# Patient Record
Sex: Male | Born: 1955
Health system: Southern US, Community
[De-identification: ages and names within clinical notes are randomized; demographics above are authoritative.]

## PROBLEM LIST (undated history)

## (undated) DIAGNOSIS — I10 Essential (primary) hypertension: Secondary | ICD-10-CM

## (undated) HISTORY — DX: Essential (primary) hypertension: I10

## (undated) HISTORY — PX: HERNIA REPAIR: SHX51

---

## 2005-06-25 ENCOUNTER — Ambulatory Visit: Payer: Self-pay | Admitting: Family Medicine

## 2005-10-05 ENCOUNTER — Ambulatory Visit: Payer: Self-pay | Admitting: Internal Medicine

## 2005-10-12 ENCOUNTER — Ambulatory Visit: Payer: Self-pay | Admitting: Internal Medicine

## 2007-02-13 ENCOUNTER — Ambulatory Visit: Payer: Self-pay | Admitting: Internal Medicine

## 2007-02-16 ENCOUNTER — Encounter: Payer: Self-pay | Admitting: Internal Medicine

## 2007-02-16 ENCOUNTER — Ambulatory Visit: Payer: Self-pay

## 2007-04-20 ENCOUNTER — Ambulatory Visit: Payer: Self-pay | Admitting: Internal Medicine

## 2007-04-20 LAB — CONVERTED CEMR LAB
ALT: 18 units/L (ref 0–53)
AST: 23 units/L (ref 0–37)
Albumin: 4.1 g/dL (ref 3.5–5.2)
Alkaline Phosphatase: 58 units/L (ref 39–117)
BUN: 18 mg/dL (ref 6–23)
Basophils Absolute: 0 10*3/uL (ref 0.0–0.1)
Basophils Relative: 0.3 % (ref 0.0–1.0)
Bilirubin Urine: NEGATIVE
Bilirubin, Direct: 0.1 mg/dL (ref 0.0–0.3)
Blood in Urine, dipstick: NEGATIVE
CO2: 31 meq/L (ref 19–32)
Calcium: 9.4 mg/dL (ref 8.4–10.5)
Chloride: 106 meq/L (ref 96–112)
Cholesterol: 163 mg/dL (ref 0–200)
Creatinine, Ser: 0.8 mg/dL (ref 0.4–1.5)
Eosinophils Absolute: 0.1 10*3/uL (ref 0.0–0.6)
Eosinophils Relative: 1.4 % (ref 0.0–5.0)
GFR calc Af Amer: 132 mL/min
GFR calc non Af Amer: 109 mL/min
Glucose, Bld: 97 mg/dL (ref 70–99)
Glucose, Urine, Semiquant: NEGATIVE
HCT: 42 % (ref 39.0–52.0)
HDL: 37.8 mg/dL — ABNORMAL LOW (ref >39.0)
Hemoglobin: 14.7 g/dL (ref 13.0–17.0)
Ketones, urine, test strip: NEGATIVE
LDL Cholesterol: 120 mg/dL — ABNORMAL HIGH (ref 0–99)
Lymphocytes Relative: 30.3 % (ref 12.0–46.0)
MCHC: 34.9 g/dL (ref 30.0–36.0)
MCV: 87.8 fL (ref 78.0–100.0)
Monocytes Absolute: 0.4 10*3/uL (ref 0.2–0.7)
Monocytes Relative: 9.5 % (ref 3.0–11.0)
Neutro Abs: 2.7 10*3/uL (ref 1.4–7.7)
Neutrophils Relative %: 58.5 % (ref 43.0–77.0)
Nitrite: NEGATIVE
PSA: 0.47 ng/mL (ref 0.10–4.00)
Platelets: 187 10*3/uL (ref 150–400)
Potassium: 5.1 meq/L (ref 3.5–5.1)
Protein, U semiquant: NEGATIVE
RBC: 4.79 M/uL (ref 4.22–5.81)
RDW: 12.8 % (ref 11.5–14.6)
Sodium: 141 meq/L (ref 135–145)
Specific Gravity, Urine: 1.025
TSH: 0.65 microintl units/mL (ref 0.35–5.50)
Total Bilirubin: 0.8 mg/dL (ref 0.3–1.2)
Total CHOL/HDL Ratio: 4.3
Total Protein: 6.6 g/dL (ref 6.0–8.3)
Triglycerides: 24 mg/dL (ref 0–149)
Urobilinogen, UA: 1
VLDL: 5 mg/dL (ref 0–40)
WBC Urine, dipstick: NEGATIVE
WBC: 4.6 10*3/uL (ref 4.5–10.5)
pH: 6

## 2007-04-27 ENCOUNTER — Ambulatory Visit: Payer: Self-pay | Admitting: Internal Medicine

## 2007-05-29 ENCOUNTER — Ambulatory Visit: Payer: Self-pay | Admitting: Internal Medicine

## 2007-05-29 LAB — CONVERTED CEMR LAB
Blood in Urine, dipstick: NEGATIVE
Glucose, Urine, Semiquant: NEGATIVE
Nitrite: NEGATIVE
Protein, U semiquant: NEGATIVE
Specific Gravity, Urine: >=1.03
Urobilinogen, UA: 0.2
WBC Urine, dipstick: NEGATIVE
pH: 5

## 2007-05-30 ENCOUNTER — Encounter: Payer: Self-pay | Admitting: Internal Medicine

## 2007-06-02 ENCOUNTER — Ambulatory Visit: Payer: Self-pay | Admitting: Internal Medicine

## 2007-09-22 ENCOUNTER — Encounter: Payer: Self-pay | Admitting: Internal Medicine

## 2007-09-22 LAB — HM COLONOSCOPY

## 2007-10-12 ENCOUNTER — Encounter: Payer: Self-pay | Admitting: Internal Medicine

## 2009-02-28 ENCOUNTER — Ambulatory Visit: Payer: Self-pay | Admitting: Internal Medicine

## 2009-02-28 LAB — CONVERTED CEMR LAB
AST: 22 units/L (ref 0–37)
BUN: 23 mg/dL (ref 6–23)
Basophils Absolute: 0 10*3/uL (ref 0.0–0.1)
Bilirubin Urine: NEGATIVE
Bilirubin, Direct: 0 mg/dL (ref 0.0–0.3)
Blood in Urine, dipstick: NEGATIVE
Cholesterol: 174 mg/dL (ref 0–200)
Creatinine, Ser: 1 mg/dL (ref 0.4–1.5)
GFR calc non Af Amer: 83.31 mL/min (ref >60)
Glucose, Bld: 112 mg/dL — ABNORMAL HIGH (ref 70–99)
Glucose, Urine, Semiquant: NEGATIVE
HCT: 43.9 % (ref 39.0–52.0)
HDL: 40.7 mg/dL (ref >39.00)
Ketones, urine, test strip: NEGATIVE
LDL Cholesterol: 122 mg/dL — ABNORMAL HIGH (ref 0–99)
Lymphs Abs: 1.6 10*3/uL (ref 0.7–4.0)
Monocytes Absolute: 0.5 10*3/uL (ref 0.1–1.0)
Monocytes Relative: 9.7 % (ref 3.0–12.0)
Neutrophils Relative %: 57.5 % (ref 43.0–77.0)
PSA: 0.41 ng/mL (ref 0.10–4.00)
Platelets: 198 10*3/uL (ref 150.0–400.0)
Potassium: 4.5 meq/L (ref 3.5–5.1)
Protein, U semiquant: NEGATIVE
RDW: 12.5 % (ref 11.5–14.6)
TSH: 0.73 microintl units/mL (ref 0.35–5.50)
Total Bilirubin: 1 mg/dL (ref 0.3–1.2)
Triglycerides: 59 mg/dL (ref 0.0–149.0)
Urobilinogen, UA: 0.2
VLDL: 11.8 mg/dL (ref 0.0–40.0)
WBC: 4.9 10*3/uL (ref 4.5–10.5)
pH: 7

## 2009-03-06 ENCOUNTER — Ambulatory Visit: Payer: Self-pay | Admitting: Internal Medicine

## 2009-10-14 ENCOUNTER — Telehealth: Payer: Self-pay | Admitting: Internal Medicine

## 2010-07-28 NOTE — Progress Notes (Signed)
Summary: vertigo  Phone Note Call from Patient   Caller: Spouse Call For: Birdie Sons MD Summary of Call: Pt's wife calls stating husband woke up with vertigo.......dizzy and nauseated when standing.  No other symptoms or illness.  [facility] [phone] Initial call taken by: Lynann Beaver CMA,  October 14, 2009 9:28 AM  Follow-up for Phone Call        see rx    New/Updated Medications: MECLIZINE HCL 25 MG TABS (MECLIZINE HCL) 1 by mouth two times a day as needed vertigo Prescriptions: MECLIZINE HCL 25 MG TABS (MECLIZINE HCL) 1 by mouth two times a day as needed vertigo  #20 x 1   Entered and Authorized by:   Birdie Sons MD   Signed by:   Birdie Sons MD on 10/14/2009   Method used:   [facility] to        [facility] [phone][facility] (retail)       [address]       [place], [place]  [address]       Ph: [phone] or [phone]       Fax: [phone]   RxID:   [id number]  Pt notified.

## 2010-11-17 ENCOUNTER — Other Ambulatory Visit (INDEPENDENT_AMBULATORY_CARE_PROVIDER_SITE_OTHER): Payer: BC Managed Care – PPO | Admitting: Internal Medicine

## 2010-11-17 DIAGNOSIS — Z0000 Encounter for general adult medical examination without abnormal findings: Secondary | ICD-10-CM

## 2010-11-17 LAB — CBC WITH DIFFERENTIAL/PLATELET
Eosinophils Relative: 1.1 % (ref 0.0–5.0)
Lymphocytes Relative: 32.1 % (ref 12.0–46.0)
Monocytes Relative: 8.1 % (ref 3.0–12.0)
Neutrophils Relative %: 58.4 % (ref 43.0–77.0)
Platelets: 204 10*3/uL (ref 150.0–400.0)
WBC: 5 10*3/uL (ref 4.5–10.5)

## 2010-11-17 LAB — LIPID PANEL
Cholesterol: 169 mg/dL (ref 0–200)
LDL Cholesterol: 108 mg/dL — ABNORMAL HIGH (ref 0–99)
Triglycerides: 64 mg/dL (ref 0.0–149.0)
VLDL: 12.8 mg/dL (ref 0.0–40.0)

## 2010-11-17 LAB — BASIC METABOLIC PANEL
CO2: 30 mEq/L (ref 19–32)
Chloride: 105 mEq/L (ref 96–112)
Potassium: 4.5 mEq/L (ref 3.5–5.1)

## 2010-11-17 LAB — PSA: PSA: 0.49 ng/mL (ref 0.10–4.00)

## 2010-11-17 LAB — HEPATIC FUNCTION PANEL
Albumin: 4.2 g/dL (ref 3.5–5.2)
Alkaline Phosphatase: 55 U/L (ref 39–117)
Total Protein: 7 g/dL (ref 6.0–8.3)

## 2010-11-20 ENCOUNTER — Encounter: Payer: Self-pay | Admitting: Internal Medicine

## 2010-11-24 ENCOUNTER — Encounter: Payer: Self-pay | Admitting: Internal Medicine

## 2010-11-24 ENCOUNTER — Ambulatory Visit (INDEPENDENT_AMBULATORY_CARE_PROVIDER_SITE_OTHER): Payer: BC Managed Care – PPO | Admitting: Internal Medicine

## 2010-11-24 VITALS — BP 144/90 | HR 84 | Temp 98.2°F | Ht 71.0 in | Wt 182.0 lb

## 2010-11-24 DIAGNOSIS — Z0000 Encounter for general adult medical examination without abnormal findings: Secondary | ICD-10-CM

## 2010-11-24 NOTE — Progress Notes (Signed)
  Subjective:    Patient ID: Steven Hamilton, male    DOB: [birth year], 55 y.o.   MRN: [id number]  HPI Well visit  Not exercising as much as he used to.  A.m. Congestion  He does note a pressure-right side of neck---he does note that when he plays golf sxs may occur. Typically occur at rest. Seems to be better after starting PPI History reviewed. No pertinent past medical history. Past Surgical History  Procedure Date  . Hernia repair     reports that he has been smoking Cigars.  He does not have any smokeless tobacco history on file. He reports that he drinks alcohol. He reports that he does not use illicit drugs. family history includes Alcohol abuse in his brother; Atrial fibrillation in his mother; Cancer in his father; Hypertension in his brother and mother; Peripheral vascular disease in his brother; and Prostate cancer in his father. No Known Allergies    Review of Systems  patient denies chest pain, shortness of breath, orthopnea. Denies lower extremity edema, abdominal pain, change in appetite, change in bowel movements. Patient denies rashes, musculoskeletal complaints. No other specific complaints in a complete review of systems.      Objective:   Physical Exam Well-developed male in no acute distress. HEENT exam atraumatic, normocephalic, extraocular muscles are intact. Conjunctivae are pink without exudate. Neck is supple without lymphadenopathy, thyromegaly, jugular venous distention. Chest is clear to auscultation without increased work of breathing. Cardiac exam S1-S2 are regular. The PMI is normal. No significant murmurs or gallops. Abdominal exam active bowel sounds, soft, nontender. No abdominal bruits. Extremities no clubbing cyanosis or edema. Peripheral pulses are normal without bruits. Neurologic exam alert and oriented without any motor or sensory deficits. Rectal exam normal tone prostate normal size without masses or asymmetry.      Assessment & Plan:  Well  visit health maint utd He will monitor BP at home

## 2010-11-24 NOTE — Patient Instructions (Signed)
Monitor BP at home Goal BP <140/90 Make appointment if BP is consistently above goal

## 2012-04-20 ENCOUNTER — Other Ambulatory Visit (INDEPENDENT_AMBULATORY_CARE_PROVIDER_SITE_OTHER): Payer: BC Managed Care – PPO

## 2012-04-20 DIAGNOSIS — Z0000 Encounter for general adult medical examination without abnormal findings: Secondary | ICD-10-CM

## 2012-04-20 LAB — CBC WITH DIFFERENTIAL/PLATELET
Basophils Absolute: 0 10*3/uL (ref 0.0–0.1)
HCT: 43.4 % (ref 39.0–52.0)
Lymphs Abs: 1.4 10*3/uL (ref 0.7–4.0)
Monocytes Relative: 8.1 % (ref 3.0–12.0)
Platelets: 198 10*3/uL (ref 150.0–400.0)
RDW: 14 % (ref 11.5–14.6)

## 2012-04-20 LAB — LIPID PANEL
Cholesterol: 184 mg/dL (ref 0–200)
LDL Cholesterol: 132 mg/dL — ABNORMAL HIGH (ref 0–99)
VLDL: 7.4 mg/dL (ref 0.0–40.0)

## 2012-04-20 LAB — BASIC METABOLIC PANEL
CO2: 25 mEq/L (ref 19–32)
Calcium: 9.3 mg/dL (ref 8.4–10.5)
GFR: 94.18 mL/min (ref >60.00)
Sodium: 138 mEq/L (ref 135–145)

## 2012-04-20 LAB — HEPATIC FUNCTION PANEL
ALT: 18 U/L (ref 0–53)
Total Bilirubin: 0.8 mg/dL (ref 0.3–1.2)

## 2012-04-20 LAB — POCT URINALYSIS DIPSTICK
Bilirubin, UA: NEGATIVE
Leukocytes, UA: NEGATIVE
Nitrite, UA: NEGATIVE
Protein, UA: NEGATIVE
pH, UA: 6

## 2012-04-24 LAB — TSH: TSH: 0.67 u[IU]/mL (ref 0.35–5.50)

## 2012-04-26 ENCOUNTER — Encounter: Payer: Self-pay | Admitting: Internal Medicine

## 2012-04-26 ENCOUNTER — Ambulatory Visit (INDEPENDENT_AMBULATORY_CARE_PROVIDER_SITE_OTHER): Payer: BC Managed Care – PPO | Admitting: Internal Medicine

## 2012-04-26 VITALS — BP 132/88 | HR 60 | Temp 97.9°F | Ht 71.0 in | Wt 181.0 lb

## 2012-04-26 DIAGNOSIS — Z0000 Encounter for general adult medical examination without abnormal findings: Secondary | ICD-10-CM

## 2012-04-26 DIAGNOSIS — Z23 Encounter for immunization: Secondary | ICD-10-CM

## 2012-04-26 NOTE — Progress Notes (Signed)
Patient ID: Steven Hamilton, male   DOB: 1955/10/29, 56 y.o.   MRN: [id number] cpx  No past medical history on file.  History   Social History  . Marital Status: Married    Spouse Name: N/A    Number of Children: N/A  . Years of Education: N/A   Occupational History  . Not on file.   Social History Main Topics  . Smoking status: Current Some Day Smoker    Types: Cigars  . Smokeless tobacco: Not on file  . Alcohol Use: 6.0 oz/week    10 Glasses of wine per week  . Drug Use: No  . Sexually Active: Not on file   Other Topics Concern  . Not on file   Social History Narrative  . No narrative on file    Past Surgical History  Procedure Date  . Hernia repair     Family History  Problem Relation Age of Onset  . Cancer Father     pancreatic  . Prostate cancer Father   . Peripheral vascular disease Brother   . Alcohol abuse Brother   . Hypertension Brother   . Hypertension Mother   . Atrial fibrillation Mother     No Known Allergies  Current Outpatient Prescriptions on File Prior to Visit  Medication Sig Dispense Refill  . lisinopril (PRINIVIL,ZESTRIL) 20 MG tablet Take 1 tablet by mouth daily.         patient denies chest pain, shortness of breath, orthopnea. Denies lower extremity edema, abdominal pain, change in appetite, change in bowel movements. Patient denies rashes, musculoskeletal complaints. No other specific complaints in a complete review of systems.   BP 132/88  Pulse 60  Temp 97.9 F (36.6 C) (Oral)  Ht 5\' 11"  (1.803 m)  Wt 181 lb (82.101 kg)  BMI 25.24 kg/m2 Well-developed male in no acute distress. HEENT exam atraumatic, normocephalic, extraocular muscles are intact. Conjunctivae are pink without exudate. Neck is supple without lymphadenopathy, thyromegaly, jugular venous distention. Chest is clear to auscultation without increased work of breathing. Cardiac exam S1-S2 are regular. The PMI is normal. No significant murmurs or gallops. Abdominal exam  active bowel sounds, soft, nontender. No abdominal bruits. Extremities no clubbing cyanosis or edema. Peripheral pulses are normal without bruits. Neurologic exam alert and oriented without any motor or sensory deficits. Rectal exam normal tone prostate normal size without masses or asymmetry.   Well Visit- health maint UTD

## 2012-12-15 ENCOUNTER — Encounter: Payer: Self-pay | Admitting: Family

## 2012-12-15 ENCOUNTER — Ambulatory Visit (INDEPENDENT_AMBULATORY_CARE_PROVIDER_SITE_OTHER): Payer: BC Managed Care – PPO | Admitting: Family

## 2012-12-15 VITALS — BP 120/74 | HR 66 | Temp 98.1°F | Wt 181.0 lb

## 2012-12-15 DIAGNOSIS — J309 Allergic rhinitis, unspecified: Secondary | ICD-10-CM

## 2012-12-15 DIAGNOSIS — R05 Cough: Secondary | ICD-10-CM

## 2012-12-15 MED ORDER — FLUTICASONE PROPIONATE 50 MCG/ACT NA SUSP: 2.0000 | Freq: Every day | NASAL | Status: DC

## 2012-12-15 NOTE — Progress Notes (Signed)
  Subjective:    Patient ID: Steven Hamilton, male    DOB: [birth year], 56 y.o.   MRN: [id number]  HPI 57 year old white male, nonsmoker, patient of Dr. Cato Mulligan is in today with complaints of cough, congestion, scratchy throat, itchy ears has been ongoing x1 month off and on. He's not taken any over-the-counter medications. Reports working outside frequently.   Review of Systems  Constitutional: Negative.   HENT: Positive for congestion, sore throat, rhinorrhea, sneezing and postnasal drip.   Respiratory: Positive for cough.   Cardiovascular: Negative.   Musculoskeletal: Negative.   Skin: Negative.   Neurological: Negative.   Psychiatric/Behavioral: Negative.    Past Medical History  Diagnosis Date  . Hypertension     History   Social History  . Marital Status: Married    Spouse Name: N/A    Number of Children: N/A  . Years of Education: N/A   Occupational History  . Not on file.   Social History Main Topics  . Smoking status: Current Some Day Smoker    Types: Cigars  . Smokeless tobacco: Not on file  . Alcohol Use: 6.0 oz/week    10 Glasses of wine per week  . Drug Use: No  . Sexually Active: Not on file   Other Topics Concern  . Not on file   Social History Narrative  . No narrative on file    Past Surgical History  Procedure Laterality Date  . Hernia repair      Family History  Problem Relation Age of Onset  . Cancer Father     pancreatic  . Prostate cancer Father   . Peripheral vascular disease Brother   . Alcohol abuse Brother   . Hypertension Brother   . Hypertension Mother   . Atrial fibrillation Mother     No Known Allergies  Current Outpatient Prescriptions on File Prior to Visit  Medication Sig Dispense Refill  . lisinopril (PRINIVIL,ZESTRIL) 20 MG tablet Take 30 mg by mouth daily.        No current facility-administered medications on file prior to visit.    BP 120/74  Pulse 66  Temp(Src) 98.1 F (36.7 C) (Oral)  Wt 181 lb (82.101  kg)  BMI 25.26 kg/m2  SpO2 98%chart     Objective:   Physical Exam  Constitutional: He is oriented to person, place, and time. He appears well-developed and well-nourished.  HENT:  Right Ear: External ear normal.  Left Ear: External ear normal.  Mouth/Throat: Oropharynx is clear and moist.  Turbinates are pale and boggy  Neck: Normal range of motion. Neck supple.  Cardiovascular: Normal rate, regular rhythm and normal heart sounds.   Pulmonary/Chest: Effort normal and breath sounds normal.  Neurological: He is alert and oriented to person, place, and time.  Skin: Skin is warm and dry.  Psychiatric: He has a normal mood and affect.          Assessment & Plan:  Assessment: 1. Hayfever 2. Cough  Plan: Flonase 2 sprays in each nostril once a day. Allegra 180 mg once daily. Call the office if symptoms worsen or persist. Recheck a schedule, and as needed.

## 2012-12-15 NOTE — Patient Instructions (Signed)
1. Allegra once daily and flonase.   Hay Fever Hay fever is an allergic reaction to particles in the air. It cannot be passed from person to person. It cannot be cured, but it can be controlled. CAUSES  Hay fever is caused by something that triggers an allergic reaction (allergens). The following are examples of allergens:  Ragweed.  Feathers.  Animal dander.  Grass and tree pollens.  Cigarette smoke.  House dust.  Pollution. SYMPTOMS   Sneezing.  Runny or stuffy nose.  Tearing eyes.  Itchy eyes, nose, mouth, throat, skin, or other area.  Cough  Sore throat.  Headache.  Decreased sense of smell or taste. DIAGNOSIS Your caregiver will perform a physical exam and ask questions about the symptoms you are having.Allergy testing may be done to determine exactly what triggers your hay fever.  TREATMENT   Over-the-counter medicines may help symptoms. These include:  Antihistamines.  Decongestants. These may help with nasal congestion.  Your caregiver may prescribe medicines if over-the-counter medicines do not work.  Some people benefit from allergy shots when other medicines are not helpful. HOME CARE INSTRUCTIONS   Avoid the allergen that is causing your symptoms, if possible.  Take all medicine as told by your caregiver. SEEK MEDICAL CARE IF:   You have severe allergy symptoms and your current medicines are not helping.  Your treatment was working at one time, but you are now experiencing symptoms.  You have sinus congestion and pressure.  You develop a fever or headache.  You have thick nasal discharge.  You have asthma and have a worsening cough and wheezing. SEEK IMMEDIATE MEDICAL CARE IF:   You have swelling of your tongue or lips.  You have trouble breathing.  You feel lightheaded or like you are going to faint.  You have cold sweats.  You have a fever. Document Released: 06/14/2005 Document Revised: 09/06/2011 Document Reviewed:  09/09/2010 [facility] Patient Information 2014 [place], [place].

## 2012-12-19 ENCOUNTER — Telehealth: Payer: Self-pay | Admitting: Internal Medicine

## 2012-12-19 NOTE — Telephone Encounter (Signed)
Pt  thinks he may have a hernia. Pt has been working out and experiencing this pain in groin area for a couple of weeks. Stopped working out and started again, pain returned.  Pt refused appt at this time. Would like a CT scan to determine if this is a hernia.  If no,pt states he will come in if need. Pls advise.

## 2012-12-19 NOTE — Telephone Encounter (Signed)
OV Should be able to diagnose without CT scan

## 2012-12-20 NOTE — Telephone Encounter (Signed)
Can you schedule appt?  If nothing soon with Dr Cato Mulligan than any provider.  Thanks

## 2012-12-22 NOTE — Telephone Encounter (Signed)
appt set w/ padonda/kh

## 2012-12-25 ENCOUNTER — Ambulatory Visit (INDEPENDENT_AMBULATORY_CARE_PROVIDER_SITE_OTHER): Payer: BC Managed Care – PPO | Admitting: Family

## 2012-12-25 ENCOUNTER — Encounter: Payer: Self-pay | Admitting: Family

## 2012-12-25 VITALS — BP 132/84 | HR 76 | Wt 186.0 lb

## 2012-12-25 DIAGNOSIS — R109 Unspecified abdominal pain: Secondary | ICD-10-CM

## 2012-12-25 DIAGNOSIS — J309 Allergic rhinitis, unspecified: Secondary | ICD-10-CM | POA: Insufficient documentation

## 2012-12-25 DIAGNOSIS — I10 Essential (primary) hypertension: Secondary | ICD-10-CM

## 2012-12-25 DIAGNOSIS — R39198 Other difficulties with micturition: Secondary | ICD-10-CM

## 2012-12-25 DIAGNOSIS — R351 Nocturia: Secondary | ICD-10-CM

## 2012-12-25 DIAGNOSIS — R102 Pelvic and perineal pain: Secondary | ICD-10-CM

## 2012-12-25 LAB — POCT URINALYSIS DIPSTICK
Blood, UA: NEGATIVE
Glucose, UA: NEGATIVE
Nitrite, UA: NEGATIVE
Protein, UA: NEGATIVE
Urobilinogen, UA: 0.2
pH, UA: 7.5

## 2012-12-25 NOTE — Patient Instructions (Addendum)

## 2012-12-25 NOTE — Progress Notes (Signed)
Subjective:    Patient ID: Steven Hamilton, male    DOB: 10-03-1955, 57 y.o.   MRN: [id number]  HPI 57 year old white male, nonsmoker, patient of Dr. Cato Mulligan is in today with complaints of pelvic pressure between his testicles and rectum that started last winter while exercising. Reports that he was doing sit-ups and noticed that the pain recurred her nose times. He discontinued exercising up until recently and his symptoms resolved but have returned. He describes it as a dull cramping that extends from the left lower pelvic region down into the testicle and rectal area. Rates it a 5-6/10. Relief with rest. Reports a decrease in urinary stream and nocturia. Denies any postvoid dribbling. Last PSA was in October was 0.45.   Review of Systems  Constitutional: Negative.   Respiratory: Negative.   Cardiovascular: Negative.   Gastrointestinal: Negative.   Endocrine: Negative.   Genitourinary: Positive for frequency.       Pelvic pressure/pain  Musculoskeletal: Negative.   Skin: Negative.   Neurological: Negative.   Psychiatric/Behavioral: Negative.    Past Medical History  Diagnosis Date  . Hypertension     History   Social History  . Marital Status: Married    Spouse Name: N/A    Number of Children: N/A  . Years of Education: N/A   Occupational History  . Not on file.   Social History Main Topics  . Smoking status: Current Some Day Smoker    Types: Cigars  . Smokeless tobacco: Not on file  . Alcohol Use: 6.0 oz/week    10 Glasses of wine per week  . Drug Use: No  . Sexually Active: Not on file   Other Topics Concern  . Not on file   Social History Narrative  . No narrative on file    Past Surgical History  Procedure Laterality Date  . Hernia repair      Family History  Problem Relation Age of Onset  . Cancer Father     pancreatic  . Prostate cancer Father   . Peripheral vascular disease Brother   . Alcohol abuse Brother   . Hypertension Brother   .  Hypertension Mother   . Atrial fibrillation Mother     No Known Allergies  Current Outpatient Prescriptions on File Prior to Visit  Medication Sig Dispense Refill  . fluticasone (FLONASE) 50 MCG/ACT nasal spray Place 2 sprays into the nose daily.  16 g  6  . lisinopril (PRINIVIL,ZESTRIL) 20 MG tablet Take 30 mg by mouth daily.        No current facility-administered medications on file prior to visit.    BP 132/84  Pulse 76  Wt 186 lb (84.369 kg)  BMI 25.95 kg/m2  SpO2 98%chart    Objective:   Physical Exam  Constitutional: He is oriented to person, place, and time. He appears well-developed and well-nourished.  Neck: Normal range of motion. Neck supple. No thyromegaly present.  Cardiovascular: Normal rate, regular rhythm and normal heart sounds.   Pulmonary/Chest: Effort normal and breath sounds normal.  Abdominal: Soft. Bowel sounds are normal.  Genitourinary: Rectum normal, prostate normal and penis normal. Guaiac negative stool.  Musculoskeletal: Normal range of motion.  Neurological: He is alert and oriented to person, place, and time.  Skin: Skin is warm and dry.  Psychiatric: He has a normal mood and affect.          Assessment & Plan:  Assessment:  1. Pelvic pressure/pain 2. Nocturia 3. Inguinal Hernia-possible  Plan:  Obtain an ultrasound of the pelvic region to rule out inguinal hernia. UA sent. Will notify patient pending results. Prostate today felt normal. Followup in the results of his ultrasound and sooner as needed.

## 2012-12-27 ENCOUNTER — Ambulatory Visit
Admission: RE | Admit: 2012-12-27 | Discharge: 2012-12-27 | Disposition: A | Discharge status: Home / Self Care | Payer: BC Managed Care – PPO | Source: Ambulatory Visit | Attending: Family | Admitting: Family

## 2012-12-27 DIAGNOSIS — R102 Pelvic and perineal pain: Secondary | ICD-10-CM

## 2012-12-27 DIAGNOSIS — R351 Nocturia: Secondary | ICD-10-CM

## 2012-12-27 DIAGNOSIS — R39198 Other difficulties with micturition: Secondary | ICD-10-CM

## 2013-01-12 ENCOUNTER — Telehealth: Payer: Self-pay | Admitting: Internal Medicine

## 2013-01-12 DIAGNOSIS — K4090 Unilateral inguinal hernia, without obstruction or gangrene, not specified as recurrent: Secondary | ICD-10-CM

## 2013-01-12 NOTE — Telephone Encounter (Signed)
Pt had made appt w/ [facility].  Steven Hamilton.  NP.called  She doesn't feel pt would benefit from urology, pt should see see general surgeon for hernia.  Can you refer pt to to General surgeon?

## 2013-01-15 NOTE — Telephone Encounter (Signed)
Pt was seen by [facility]

## 2013-01-16 NOTE — Telephone Encounter (Signed)
Referral placed.

## 2013-01-17 NOTE — Telephone Encounter (Signed)
NP at [facility] called and stated that the patient is still not aware of these changes. Please call him and update him, thank you!

## 2013-01-17 NOTE — Telephone Encounter (Signed)
Pt aware referral was changed. Pt wants to discuss with his "doctor friend" to see if he recommends someone in particular. Advise to let us know if we need to change location

## 2013-01-19 ENCOUNTER — Telehealth: Payer: Self-pay | Admitting: Internal Medicine

## 2013-01-19 NOTE — Telephone Encounter (Signed)
Please disregard previous message. Pt wife scheduled appt with Dr Johna Sheriff

## 2013-01-19 NOTE — Telephone Encounter (Signed)
Pt would like to see Dr Johna Sheriff for his referral to general surgeon. Pt has appt 8/1 Friday w/ Dr Andrey Campanile. Pt has a friend (a vascular surgein) that wants him to see Dr Johna Sheriff. Pt will call and cancel this other appt and wait to hear from [place]?

## 2013-01-26 ENCOUNTER — Ambulatory Visit (INDEPENDENT_AMBULATORY_CARE_PROVIDER_SITE_OTHER): Payer: BC Managed Care – PPO | Admitting: General Surgery

## 2013-02-14 ENCOUNTER — Ambulatory Visit (INDEPENDENT_AMBULATORY_CARE_PROVIDER_SITE_OTHER): Payer: BC Managed Care – PPO | Admitting: General Surgery

## 2013-03-07 ENCOUNTER — Other Ambulatory Visit: Payer: Self-pay | Admitting: Gastroenterology

## 2013-03-07 ENCOUNTER — Ambulatory Visit
Admission: RE | Admit: 2013-03-07 | Discharge: 2013-03-07 | Disposition: A | Discharge status: Home / Self Care | Payer: BC Managed Care – PPO | Source: Ambulatory Visit | Attending: Gastroenterology | Admitting: Gastroenterology

## 2013-03-07 DIAGNOSIS — R109 Unspecified abdominal pain: Secondary | ICD-10-CM

## 2013-03-07 MED ORDER — IOHEXOL 300 MG/ML  SOLN
100.0000 mL | Freq: Once | INTRAMUSCULAR | Status: AC | PRN
  Administered 2013-03-07: 100 mL via INTRAVENOUS

## 2013-06-24 ENCOUNTER — Other Ambulatory Visit: Payer: Self-pay | Admitting: Cardiovascular Disease

## 2013-06-25 NOTE — Telephone Encounter (Signed)
Rx was sent to pharmacy electronically.  (last OV 03/30/2012)

## 2013-08-30 ENCOUNTER — Telehealth: Payer: Self-pay | Admitting: *Deleted

## 2013-08-30 NOTE — Telephone Encounter (Signed)
Returned call and pt verified x 2.  Pt informed message received and refill cannot be sent until he schedules an appt as his last OV was in Oct. 2013. Pt stated he thought he didn't need to be seen again from his last OV.  Pt informed an annual visit is required for refills or he can see PCP for refills.  Pt stated he will call his PCP, Dr. Shelia Media and see if they can do it.  If not, pt will call back to schedule first available appt w/ Dr. Loletha Grayer and understands refill will be sent to [name] to last until appt.  Operator to send message to triage if pt calls back to schedule appt.

## 2013-08-30 NOTE — Telephone Encounter (Signed)
Pt was calling in regards to his Lisinopril 20 mg needing to be refilled. He is out of it and would like it to be called into [facility].

## 2013-09-14 ENCOUNTER — Other Ambulatory Visit: Payer: Self-pay | Admitting: *Deleted

## 2013-09-14 ENCOUNTER — Other Ambulatory Visit: Payer: Self-pay | Admitting: Cardiovascular Disease

## 2013-09-14 MED ORDER — LISINOPRIL 30 MG PO TABS: 30.0000 mg | ORAL_TABLET | Freq: Every day | ORAL | Status: DC

## 2013-09-14 NOTE — Telephone Encounter (Signed)
Lisinopril 30mg  refilled and sent electronically to [facility] #90 w/3 refills.

## 2014-10-09 ENCOUNTER — Ambulatory Visit (INDEPENDENT_AMBULATORY_CARE_PROVIDER_SITE_OTHER): Payer: BLUE CROSS/BLUE SHIELD | Admitting: Sports Medicine

## 2014-10-09 ENCOUNTER — Encounter: Payer: Self-pay | Admitting: Sports Medicine

## 2014-10-09 VITALS — BP 112/70 | Ht 71.0 in | Wt 185.0 lb

## 2014-10-09 DIAGNOSIS — M25562 Pain in left knee: Secondary | ICD-10-CM | POA: Diagnosis not present

## 2014-10-09 NOTE — Assessment & Plan Note (Signed)
I explained to the patient that this is consistent with patellofemoral syndrome There is not significant enough change to suggest patellofemoral osteoarthritis  I gave him a home exercise program to try to strengthen the vastus medialis obliqus  He will avoid deep knee bends  Use a patellar tendon strap for activity  Monitor his response to this conservative program and if he does well he can recheck with me when necessary

## 2014-10-09 NOTE — Progress Notes (Signed)
Patient ID: Steven Hamilton, male   DOB: February 22, 1956, 59 y.o.   MRN: [id number]  Patient referred courtesy of Dr. 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 Palpation normal with no warmth or joint line tenderness or patellar tenderness or condyle tenderness. ROM normal in flexion and extension and lower leg rotation. Ligaments with solid consistent endpoints including ACL, PCL, LCL, MCL. Negative Mcmurray's and provocative meniscal tests. Non painful patellar compression. Patellar and quadriceps tendons unremarkable. Hamstring and quadriceps strength is normal.  There is some slight weakness in the VMO muscle not seen on the right  RT knee exam was normal  Feet show normal arch height  Walking gait is normal  Ultrasound of left knee No superior patellar pouch effusion Normal quadriceps and patellar tendons Normal medial and lateral meniscus without ultrasound findings of arthritis Trochlear view shows some thinning of the cartilage in the patellofemoral groove There is a hypoechoic area on the inferior surface of the patella with some cartilage thinning

## 2014-12-10 ENCOUNTER — Other Ambulatory Visit: Payer: Self-pay | Admitting: Cardiovascular Disease

## 2014-12-12 ENCOUNTER — Other Ambulatory Visit: Payer: Self-pay | Admitting: Cardiovascular Disease

## 2015-08-29 ENCOUNTER — Encounter: Payer: Self-pay | Admitting: Cardiovascular Disease

## 2015-11-28 DIAGNOSIS — M4806 Spinal stenosis, lumbar region: Secondary | ICD-10-CM | POA: Diagnosis not present

## 2015-11-28 DIAGNOSIS — M545 Low back pain: Secondary | ICD-10-CM | POA: Diagnosis not present

## 2015-12-15 DIAGNOSIS — M545 Low back pain: Secondary | ICD-10-CM | POA: Diagnosis not present

## 2015-12-19 DIAGNOSIS — M4806 Spinal stenosis, lumbar region: Secondary | ICD-10-CM | POA: Diagnosis not present

## 2015-12-31 DIAGNOSIS — M545 Low back pain: Secondary | ICD-10-CM | POA: Diagnosis not present

## 2016-01-07 DIAGNOSIS — M545 Low back pain: Secondary | ICD-10-CM | POA: Diagnosis not present

## 2016-01-09 DIAGNOSIS — M545 Low back pain: Secondary | ICD-10-CM | POA: Diagnosis not present

## 2016-01-19 DIAGNOSIS — M545 Low back pain: Secondary | ICD-10-CM | POA: Diagnosis not present

## 2016-02-03 ENCOUNTER — Encounter (INDEPENDENT_AMBULATORY_CARE_PROVIDER_SITE_OTHER): Payer: Self-pay

## 2016-02-03 ENCOUNTER — Ambulatory Visit (INDEPENDENT_AMBULATORY_CARE_PROVIDER_SITE_OTHER): Payer: BLUE CROSS/BLUE SHIELD | Admitting: Cardiovascular Disease

## 2016-02-03 ENCOUNTER — Encounter: Payer: Self-pay | Admitting: Cardiovascular Disease

## 2016-02-03 VITALS — BP 152/86 | HR 66 | Ht 71.0 in | Wt 188.0 lb

## 2016-02-03 DIAGNOSIS — R002 Palpitations: Secondary | ICD-10-CM | POA: Insufficient documentation

## 2016-02-03 DIAGNOSIS — I10 Essential (primary) hypertension: Secondary | ICD-10-CM | POA: Diagnosis not present

## 2016-02-03 DIAGNOSIS — Q631 Lobulated, fused and horseshoe kidney: Secondary | ICD-10-CM

## 2016-02-03 NOTE — Patient Instructions (Addendum)
Dr Sallyanne Kuster has recommended making the following medication changes: 1. START Bystolic 5 mg - take 1 tablet (5 mg total) by mouth daily 2. CONTINUE Lisinopril 40 mg daily  Dr C recommends that you schedule a follow-up appointment in 3 months.  If you need a refill on your cardiac medications before your next appointment, please call your pharmacy.   Medication samples have been provided to the patient. Drug name: Bystolic 5 mg Qty: 35 tabs LOT: [id number] Exp.Date: 09/2017

## 2016-02-03 NOTE — Progress Notes (Signed)
Cardiology Office Note    Date:  02/03/2016   ID:  [name], [name] [birth year], MRN [id number]  PCP:  Horatio Pel, MD  Cardiologist:   Sanda Klein, MD   Chief Complaint  Patient presents with  . Follow-up    elevated bp    History of Present Illness:  Steven Hamilton is a 60 y.o. male with essential hypertension who presents with complaints of palpitations and mildly reduced exercise tolerance. 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 He is most describing palpitations with an irregularity in his heartbeat that makes him feel briefly breathless. He denies chest tightness or pain and does not have orthopnea, PND, leg edema, erectile dysfunction, focal neurological deficits or intermittent claudication.  His blood pressure has recently been elevated and even increasing the dose of lisinopril to 40 mg daily has not had a positive impact so far. Does admit to somewhat increased sodium intake. He is trying to lose weight and avoids pizza, french fries and burgers, but seems to be overindulging in salads with a lot of salty additions.  He exercises regularly on his treadmill at home and never experiences palpitations or dyspnea during exercise. He drinks 2 cups of coffee every morning and avoids caffeinated beverages throughout the rest of the day. He tries to drink mostly water.  He had a normal treadmill stress test in 2012 (with hypertensive response to exercise). In 2014, CT showed "Kidneys demonstrate congenital crossed fused ectopia".    Past Medical History:  Diagnosis Date  . Hypertension     Past Surgical History:  Procedure Laterality Date  . HERNIA REPAIR      Current Medications: Outpatient Medications Prior to Visit  Medication Sig Dispense Refill  . fluticasone (FLONASE) 50 MCG/ACT nasal spray Place 2 sprays into the nose daily. 16 g 6  . lisinopril  (PRINIVIL,ZESTRIL) 40 MG tablet Take 40 mg by mouth daily.   0  . lisinopril (PRINIVIL,ZESTRIL) 30 MG tablet Take 1 tablet (30 mg total) by mouth daily. (Patient not taking: Reported on 02/03/2016) 90 tablet 3   No facility-administered medications prior to visit.      Allergies:   Review of patient's allergies indicates no known allergies.   Social History   Social History  . Marital status: Married    Spouse name: N/A  . Number of children: N/A  . Years of education: N/A   Social History Main Topics  . Smoking status: Current Some Day Smoker    Types: Cigars  . Smokeless tobacco: Never Used  . Alcohol use 6.0 oz/week    10 Glasses of wine per week  . Drug use: No  . Sexual activity: Not Asked   Other Topics Concern  . None   Social History Narrative  . None     Family History:  The patient's family history includes Alcohol abuse in his brother; Atrial fibrillation in his mother; Cancer in his father; Hypertension in his brother and mother; Peripheral vascular disease in his brother; Prostate cancer in his father.   ROS:   Please see the history of present illness.    ROS All other systems reviewed and are negative.   PHYSICAL EXAM:   VS:  BP (!) 152/86   Pulse 66   Ht 5\' 11"  (1.803 m)   Wt 188 lb (85.3 kg)   BMI 26.22 kg/m    GEN:  Well nourished, well developed, in no acute distress  HEENT: normal  Neck: no JVD, carotid bruits, or masses Cardiac: RRR; no murmurs, rubs, or gallops,no edema , worse prominent respiratory arrhythmia consistent with high vagal tone Respiratory:  clear to auscultation bilaterally, normal work of breathing GI: soft, nontender, nondistended, + BS MS: no deformity or atrophy  Skin: warm and dry, no rash Neuro:  Alert and Oriented x 3, Strength and sensation are intact Psych: euthymic mood, full affect  Wt Readings from Last 3 Encounters:  02/03/16 188 lb (85.3 kg)  10/09/14 185 lb (83.9 kg)  12/25/12 186 lb (84.4 kg)       Studies/Labs Reviewed:   EKG:  EKG is ordered today.  The ekg ordered today demonstrates Normal sinus rhythm with incomplete right bundle branch block  Recent Labs: In 2013 creatinine was 0.9 and potassium 4.8. I don't have any more recent labs.  Lipid Panel    Component Value Date/Time   CHOL 184 04/20/2012 0903   TRIG 37.0 04/20/2012 0903   HDL 44.40 04/20/2012 0903   CHOLHDL 4 04/20/2012 0903   VLDL 7.4 04/20/2012 0903   LDLCALC 132 (H) 04/20/2012 0903      ASSESSMENT:    1. Essential hypertension   2. Palpitations   3. Congenital fusion of kidneys      PLAN:  In order of problems listed above:  1. HTN: Blood pressure control is suboptimal. Encouraged him in his efforts to reduce sodium in his diet. Continue daily exercise. He is only a few pounds above ideal weight. Will add a low dose of beta blocker to see if this improves both his blood pressure and the palpitations. Start bystolic 5 mg daily. Samples given.  We'll check his blood pressure this weekend and adjust medications accordingly. 2. Palpitations: He is probably describing isolated PACs or PVCs. Asked him to try to cut down his caffeine intake in half. If he tolerates Bystolic well will try to gradually transition from the ACE inhibitor to higher dose beta blocker for his blood pressure control as well. 3. Congenital crossed fused kidney ectopia  If these simple measures do not lead to improved symptomatology, consider echocardiogram and arrhythmia monitor. Also need to see if he has had updated routine chemistries. If there are abnormalities in kidney function or blood pressure remains difficult to control, may need to consider secondary hypertension related to his congenital kidney abnormality.  Medication Adjustments/Labs and Tests Ordered: Current medicines are reviewed at length with the patient today.  Concerns regarding medicines are outlined above.  Medication changes, Labs and Tests ordered today are  listed in the Patient Instructions below. Patient Instructions  Dr Sallyanne Kuster has recommended making the following medication changes: 1. START Bystolic 5 mg - take 1 tablet (5 mg total) by mouth daily 2. CONTINUE Lisinopril 40 mg daily  Dr C recommends that you schedule a follow-up appointment in 3 months.  If you need a refill on your cardiac medications before your next appointment, please call your pharmacy.   Medication samples have been provided to the patient. Drug name: Bystolic 5 mg Qty: 35 tabs LOT: [id number] Exp.Date: 09/2017    SignedSanda Klein, MD  02/03/2016 2:59 PM    [facility] [address], [place], [place]  [address] Phone: [phone]; Fax: [phone]

## 2016-02-04 ENCOUNTER — Telehealth: Payer: Self-pay

## 2016-02-04 NOTE — Telephone Encounter (Signed)
lmtcb

## 2016-02-04 NOTE — Telephone Encounter (Signed)
-----   Message from Sanda Klein, MD sent at 02/03/2016  3:13 PM EDT ----- Has he had blood work in the last 12 months? If not need a cmet, please

## 2016-02-08 ENCOUNTER — Telehealth: Payer: Self-pay | Admitting: Cardiovascular Disease

## 2016-02-08 MED ORDER — NEBIVOLOL HCL 10 MG PO TABS: 10.0000 mg | ORAL_TABLET | Freq: Every day | ORAL | 3 refills | Status: DC

## 2016-02-08 NOTE — Telephone Encounter (Signed)
BP checked by me today 122/71 mm Hg. Feeling better on Bystolic. Less dyspnea with activity. Plan to increase Bystolic to 10 mg daily, stop lisinopril. If necessary will increase to 20 mg daily (if he develops symptomatic bradycardia, restart a lower dose of lisinopril). Recheck BP in 2 weeks.  Sanda Klein, MD, [facility] [phone] office [phone] pager

## 2016-02-11 DIAGNOSIS — D1801 Hemangioma of skin and subcutaneous tissue: Secondary | ICD-10-CM | POA: Diagnosis not present

## 2016-02-11 DIAGNOSIS — L570 Actinic keratosis: Secondary | ICD-10-CM | POA: Diagnosis not present

## 2016-02-11 DIAGNOSIS — D485 Neoplasm of uncertain behavior of skin: Secondary | ICD-10-CM | POA: Diagnosis not present

## 2016-02-12 ENCOUNTER — Telehealth: Payer: Self-pay

## 2016-02-12 ENCOUNTER — Ambulatory Visit (HOSPITAL_COMMUNITY): Payer: BLUE CROSS/BLUE SHIELD | Attending: Cardiovascular Disease

## 2016-02-12 ENCOUNTER — Other Ambulatory Visit: Payer: Self-pay

## 2016-02-12 DIAGNOSIS — I340 Nonrheumatic mitral (valve) insufficiency: Secondary | ICD-10-CM | POA: Diagnosis not present

## 2016-02-12 DIAGNOSIS — Z720 Tobacco use: Secondary | ICD-10-CM | POA: Diagnosis not present

## 2016-02-12 DIAGNOSIS — R0602 Shortness of breath: Secondary | ICD-10-CM

## 2016-02-12 DIAGNOSIS — I351 Nonrheumatic aortic (valve) insufficiency: Secondary | ICD-10-CM | POA: Diagnosis not present

## 2016-02-12 DIAGNOSIS — R002 Palpitations: Secondary | ICD-10-CM | POA: Insufficient documentation

## 2016-02-12 DIAGNOSIS — I119 Hypertensive heart disease without heart failure: Secondary | ICD-10-CM | POA: Insufficient documentation

## 2016-02-12 DIAGNOSIS — R0600 Dyspnea, unspecified: Secondary | ICD-10-CM | POA: Diagnosis present

## 2016-02-12 NOTE — Telephone Encounter (Signed)
No problem.

## 2016-02-24 NOTE — Telephone Encounter (Signed)
Called patient's pcp for most recent labs. Labs received.

## 2016-02-26 ENCOUNTER — Other Ambulatory Visit: Payer: Self-pay | Admitting: *Deleted

## 2016-02-26 MED ORDER — NEBIVOLOL HCL 2.5 MG PO TABS: 2.5000 mg | ORAL_TABLET | Freq: Every day | ORAL | 3 refills | Status: DC

## 2016-02-26 NOTE — Telephone Encounter (Signed)
PER DR CROITORU,  PRESCRIPTION E-SENT TO [facility]-- BYSTOLIC 2.5 MG- 123456 TABLET  3 REFILLS -ONE TABLET DAILY.

## 2016-05-05 ENCOUNTER — Ambulatory Visit (INDEPENDENT_AMBULATORY_CARE_PROVIDER_SITE_OTHER): Payer: BLUE CROSS/BLUE SHIELD | Admitting: Cardiovascular Disease

## 2016-05-05 ENCOUNTER — Encounter: Payer: Self-pay | Admitting: Cardiovascular Disease

## 2016-05-05 VITALS — BP 148/90 | HR 51 | Ht 71.0 in | Wt 191.0 lb

## 2016-05-05 DIAGNOSIS — I10 Essential (primary) hypertension: Secondary | ICD-10-CM | POA: Diagnosis not present

## 2016-05-05 DIAGNOSIS — Q631 Lobulated, fused and horseshoe kidney: Secondary | ICD-10-CM

## 2016-05-05 DIAGNOSIS — R002 Palpitations: Secondary | ICD-10-CM | POA: Diagnosis not present

## 2016-05-05 MED ORDER — HYDROCHLOROTHIAZIDE 12.5 MG PO CAPS: 12.5000 mg | ORAL_CAPSULE | Freq: Every day | ORAL | 3 refills | Status: DC

## 2016-05-05 NOTE — Patient Instructions (Signed)
START Hydrochlorothiazide 12.5 mg once daily  Dr Sallyanne Kuster recommends that you schedule a follow-up appointment in 1 year. You will receive a reminder letter in the mail two months in advance. If you don't receive a letter, please call our office to schedule the follow-up appointment.  If you need a refill on your cardiac medications before your next appointment, please call your pharmacy.

## 2016-05-05 NOTE — Progress Notes (Signed)
Cardiology Office Note    Date:  05/05/2016   ID:  Steven, Hamilton 1956/05/17, MRN [id number]  PCP:  Steven Pel, MD  Cardiologist:   Steven Klein, MD   Chief Complaint  Patient presents with  . Follow-up    History of Present Illness:  Steven Hamilton is a 60 y.o. male with essential hypertension who presents For follow-up after several adjustments in his blood pressure medications.   After adding a beta blocker to his ACE inhibitor his complaints of poor exercise tolerance have resolved. She did not tolerate higher doses of beta blocker due to fatigue. He denies chest tightness or pain and does not have orthopnea, PND, leg edema, erectile dysfunction, focal neurological deficits or intermittent claudication.  His blood pressure is still elevated. He is trying to lose weight and avoids high fat meals, but the symptoms to have, the price of increase intake of carbohydrates.  He exercises regularly on his treadmill at home, 45 minutes most days of the week and never experiences palpitations or dyspnea during exercise. He tries to drink mostly water.  He had a normal treadmill stress test in 2012 (with hypertensive response to exercise). In 2014, CT showed "Kidneys demonstrate congenital crossed fused ectopia".  Had labs with Dr. Deland Hamilton in March 2017. Hemoglobin 14.6, creatinine 1.0, normal liver function tests, total cholesterol 175, triglycerides 49, HDL 50, LDL 115 bland urinalysis  Past Medical History:  Diagnosis Date  . Hypertension     Past Surgical History:  Procedure Laterality Date  . HERNIA REPAIR      Current Medications: Outpatient Medications Prior to Visit  Medication Sig Dispense Refill  . lisinopril (PRINIVIL,ZESTRIL) 40 MG tablet Take 40 mg by mouth daily.   0  . nebivolol (BYSTOLIC) 2.5 MG tablet Take 1 tablet (2.5 mg total) by mouth daily. 90 tablet 3  . fluticasone (FLONASE) 50 MCG/ACT nasal spray Place 2 sprays into the nose daily.  (Patient not taking: Reported on 05/05/2016) 16 g 6   No facility-administered medications prior to visit.      Allergies:   Patient has no known allergies.   Social History   Social History  . Marital status: Married    Spouse name: N/A  . Number of children: N/A  . Years of education: N/A   Social History Main Topics  . Smoking status: Current Some Day Smoker    Types: Cigars  . Smokeless tobacco: Never Used  . Alcohol use 6.0 oz/week    10 Glasses of wine per week  . Drug use: No  . Sexual activity: Not Asked   Other Topics Concern  . None   Social History Narrative  . None     Family History:  The patient's family history includes Alcohol abuse in his brother; Atrial fibrillation in his mother; Cancer in his father; Hypertension in his brother and mother; Peripheral vascular disease in his brother; Prostate cancer in his father.   ROS:   Please see the history of present illness.    ROS All other systems reviewed and are negative.   PHYSICAL EXAM:   VS:  BP (!) 148/90 (BP Location: Left Arm, Patient Position: Sitting, Cuff Size: Normal)   Pulse (!) 51   Ht 5\' 11"  (1.803 m)   Wt 191 lb (86.6 kg)   SpO2 97%   BMI 26.64 kg/m    Blood pressure rechecked 20 minutes later was unchanged GEN: Well nourished, well developed, in no acute distress  HEENT: normal  Neck: no JVD, carotid bruits, or masses Cardiac: RRR; no murmurs, rubs, or gallops,no edema , worse prominent respiratory arrhythmia consistent with high vagal tone Respiratory:  clear to auscultation bilaterally, normal work of breathing GI: soft, nontender, nondistended, + BS MS: no deformity or atrophy  Skin: warm and dry, no rash Neuro:  Alert and Oriented x 3, Strength and sensation are intact Psych: euthymic mood, full affect  Wt Readings from Last 3 Encounters:  05/05/16 191 lb (86.6 kg)  02/03/16 188 lb (85.3 kg)  10/09/14 185 lb (83.9 kg)      Studies/Labs Reviewed:   EKG:  EKG is ordered  today.  The ekg ordered today demonstrates Normal sinus rhythm with incomplete right bundle branch block  Recent Labs: labs with Dr. Deland Hamilton in March 2017. Hemoglobin 14.6, creatinine 1.0, normal liver function tests,   Lipid Panel: total cholesterol 175, TG 49, HDL 50, LDL 115 bland urinalysis ASSESSMENT:    1. Essential hypertension   2. Palpitations   3. Congenital fusion of kidneys      PLAN:  In order of problems listed above:  1. HTN: Blood pressure control remains suboptimal. Encouraged him in his efforts to reduce sodium in his diet. Continue daily exercise. He is only a few pounds above ideal weight, about increasing intake of protein and unsaturated fats rather than indulging in carbs. Added hydrochlorothiazide 12.5 mg once daily to his ACE inhibitor and beta blocker. 2. Palpitations: Seem to have improved trying a beta blocker. Unable to increase the dose of the beta blocker due to resting sinus bradycardia 3. Congenital crossed fused kidney ectopia: If we fail to provide good blood pressure control with medical means, consider evaluation for renal artery stenosis with ultrasonography.  If these simple measures do not lead to improved symptomatology, consider echocardiogram and arrhythmia monitor. Also need to see if he has had updated routine chemistries. If there are abnormalities in kidney function or blood pressure remains difficult to control, may need to consider secondary hypertension related to his congenital kidney abnormality.  Medication Adjustments/Labs and Tests Ordered: Current medicines are reviewed at length with the patient today.  Concerns regarding medicines are outlined above.  Medication changes, Labs and Tests ordered today are listed in the Patient Instructions below. Patient Instructions  START Hydrochlorothiazide 12.5 mg once daily  Dr Steven Hamilton recommends that you schedule a follow-up appointment in 1 year. You will receive a reminder letter in  the mail two months in advance. If you don't receive a letter, please call our office to schedule the follow-up appointment.  If you need a refill on your cardiac medications before your next appointment, please call your pharmacy.    Signed, Steven Klein, MD  05/05/2016 12:53 PM    [facility], [place], [place]  [address] Phone: [phone]; Fax: [phone]

## 2016-06-08 ENCOUNTER — Telehealth: Payer: Self-pay

## 2016-06-08 DIAGNOSIS — R0789 Other chest pain: Secondary | ICD-10-CM

## 2016-06-08 DIAGNOSIS — R0602 Shortness of breath: Secondary | ICD-10-CM

## 2016-06-09 ENCOUNTER — Telehealth (HOSPITAL_COMMUNITY): Payer: Self-pay

## 2016-06-09 NOTE — Telephone Encounter (Signed)
Encounter complete. 

## 2016-06-11 ENCOUNTER — Ambulatory Visit (HOSPITAL_COMMUNITY)
Admission: RE | Admit: 2016-06-11 | Discharge: 2016-06-11 | Disposition: A | Discharge status: Home / Self Care | Payer: BLUE CROSS/BLUE SHIELD | Source: Ambulatory Visit | Attending: Cardiology | Admitting: Cardiology

## 2016-06-11 DIAGNOSIS — R0602 Shortness of breath: Secondary | ICD-10-CM | POA: Diagnosis not present

## 2016-06-11 DIAGNOSIS — R0789 Other chest pain: Secondary | ICD-10-CM

## 2016-06-11 LAB — EXERCISE TOLERANCE TEST
CHL CUP MPHR: 161 {beats}/min
CHL CUP RESTING HR STRESS: 63 {beats}/min
CSEPEDS: 0 s
CSEPEW: 15.3 METS
CSEPPHR: 157 {beats}/min
Exercise duration (min): 13 min
Percent HR: 97 %
RPE: 17

## 2016-06-17 ENCOUNTER — Telehealth: Payer: Self-pay

## 2016-06-17 DIAGNOSIS — R9439 Abnormal result of other cardiovascular function study: Secondary | ICD-10-CM

## 2016-06-17 DIAGNOSIS — Q631 Lobulated, fused and horseshoe kidney: Secondary | ICD-10-CM

## 2016-06-17 DIAGNOSIS — I10 Essential (primary) hypertension: Secondary | ICD-10-CM

## 2016-06-17 NOTE — Telephone Encounter (Signed)
GXT ordered per MCr.

## 2016-06-17 NOTE — Telephone Encounter (Signed)
-----   Message from Sanda Klein, MD sent at 06/16/2016 11:11 PM EST ----- Please schedule [facility] for:  1. Coronary CT angio - for abnormal stress test (to be read by [facility] or [facility])., and 2. Renal artery CT angio - for possible renovascular hypertension  Ideally, both same day and both before year end.  No pressure...  Thanks! MCr

## 2016-06-18 ENCOUNTER — Encounter: Payer: Self-pay | Admitting: Cardiovascular Disease

## 2016-06-24 ENCOUNTER — Other Ambulatory Visit: Payer: BLUE CROSS/BLUE SHIELD

## 2016-06-25 ENCOUNTER — Ambulatory Visit (HOSPITAL_COMMUNITY): Payer: BLUE CROSS/BLUE SHIELD

## 2016-06-25 ENCOUNTER — Encounter (HOSPITAL_COMMUNITY): Payer: Self-pay

## 2016-06-25 ENCOUNTER — Ambulatory Visit (HOSPITAL_COMMUNITY)
Admission: RE | Admit: 2016-06-25 | Discharge: 2016-06-25 | Disposition: A | Discharge status: Home / Self Care | Payer: BLUE CROSS/BLUE SHIELD | Source: Ambulatory Visit | Attending: Cardiovascular Disease | Admitting: Cardiovascular Disease

## 2016-06-25 DIAGNOSIS — R079 Chest pain, unspecified: Secondary | ICD-10-CM | POA: Diagnosis not present

## 2016-06-25 DIAGNOSIS — R9439 Abnormal result of other cardiovascular function study: Secondary | ICD-10-CM | POA: Diagnosis not present

## 2016-06-25 DIAGNOSIS — I2510 Atherosclerotic heart disease of native coronary artery without angina pectoris: Secondary | ICD-10-CM | POA: Insufficient documentation

## 2016-06-25 MED ORDER — IOPAMIDOL (ISOVUE-370) INJECTION 76%
INTRAVENOUS | Status: AC
  Administered 2016-06-25: 80 mL
  Filled 2016-06-25: qty 100

## 2016-06-25 MED ORDER — NITROGLYCERIN 0.4 MG SL SUBL
0.4000 mg | SUBLINGUAL_TABLET | SUBLINGUAL | Status: DC | PRN
  Filled 2016-06-25: qty 25

## 2016-06-25 MED ORDER — NITROGLYCERIN 0.4 MG SL SUBL
SUBLINGUAL_TABLET | SUBLINGUAL | Status: AC
  Administered 2016-06-25: 0.4 mg via SUBLINGUAL
  Filled 2016-06-25: qty 1

## 2016-07-01 ENCOUNTER — Ambulatory Visit (INDEPENDENT_AMBULATORY_CARE_PROVIDER_SITE_OTHER)
Admission: RE | Admit: 2016-07-01 | Discharge: 2016-07-01 | Disposition: A | Discharge status: Home / Self Care | Payer: BLUE CROSS/BLUE SHIELD | Source: Ambulatory Visit | Attending: Cardiovascular Disease | Admitting: Cardiovascular Disease

## 2016-07-01 ENCOUNTER — Telehealth: Payer: Self-pay

## 2016-07-01 DIAGNOSIS — Q631 Lobulated, fused and horseshoe kidney: Secondary | ICD-10-CM

## 2016-07-01 DIAGNOSIS — I10 Essential (primary) hypertension: Secondary | ICD-10-CM

## 2016-07-01 MED ORDER — VERAPAMIL HCL ER 120 MG PO TBCR: 120.0000 mg | EXTENDED_RELEASE_TABLET | Freq: Every day | ORAL | 11 refills | Status: DC

## 2016-07-01 MED ORDER — IOPAMIDOL (ISOVUE-370) INJECTION 76%
100.0000 mL | Freq: Once | INTRAVENOUS | Status: AC | PRN
  Administered 2016-07-01: 100 mL via INTRAVENOUS

## 2016-07-01 NOTE — Telephone Encounter (Signed)
Bystolic discontinued.  Verapamil 120 mg QD sent to patient's pharmacy electronically.

## 2016-07-01 NOTE — Telephone Encounter (Signed)
Notes Recorded by Sanda Klein, MD on 07/01/2016 at 10:43 AM EST Called with results of Renal CTA and coronary CTA. No renal artery stenosis. Coronary CTA without severe stenoses, but with unexpectedly high calcium score and a couple of areas of moderate plaque.  Need to try to reach target LDL<70. Will try lifestyle changes, but there is a good chance he will need statin. Will wait for upcoming labs with Dr. Shelia Media in a couple of months. Try verapamil as replacement for bystolic to see if this will help with what are likely outflow tract PVCs at peak exercise, as well as improving his blood pressure. Resting bradycardia may limit this. Verapamil at very low dose, 120 mg daily.  Stop bystolic.

## 2016-07-23 ENCOUNTER — Telehealth: Payer: Self-pay | Admitting: Cardiovascular Disease

## 2016-07-27 ENCOUNTER — Other Ambulatory Visit: Payer: Self-pay | Admitting: Cardiovascular Disease

## 2016-07-27 ENCOUNTER — Telehealth: Payer: Self-pay | Admitting: *Deleted

## 2016-07-27 MED ORDER — LISINOPRIL 40 MG PO TABS: 40.0000 mg | ORAL_TABLET | Freq: Every day | ORAL | 2 refills | Status: DC

## 2016-07-27 NOTE — Telephone Encounter (Signed)
He should be on lisinopril 40 mg daily please.

## 2016-07-27 NOTE — Telephone Encounter (Signed)
Pt seen in November Dr. Victorino December last OV note discussed management of HTN, including continuation of ACE (lisinopril). It is OK to refill this medication.

## 2016-07-27 NOTE — Telephone Encounter (Signed)
Routed to Dr. Sallyanne Kuster for clarification. Per Brandy's discussion w patient, he reports he is taking 40mg  lisinopril daily.

## 2016-09-01 DIAGNOSIS — Z0000 Encounter for general adult medical examination without abnormal findings: Secondary | ICD-10-CM | POA: Diagnosis not present

## 2016-09-01 DIAGNOSIS — Z125 Encounter for screening for malignant neoplasm of prostate: Secondary | ICD-10-CM | POA: Diagnosis not present

## 2016-09-06 DIAGNOSIS — I2510 Atherosclerotic heart disease of native coronary artery without angina pectoris: Secondary | ICD-10-CM | POA: Diagnosis not present

## 2016-09-06 DIAGNOSIS — Z0001 Encounter for general adult medical examination with abnormal findings: Secondary | ICD-10-CM | POA: Diagnosis not present

## 2016-09-13 DIAGNOSIS — Z1212 Encounter for screening for malignant neoplasm of rectum: Secondary | ICD-10-CM | POA: Diagnosis not present

## 2016-09-13 DIAGNOSIS — Z1211 Encounter for screening for malignant neoplasm of colon: Secondary | ICD-10-CM | POA: Diagnosis not present

## 2016-10-05 DIAGNOSIS — I10 Essential (primary) hypertension: Secondary | ICD-10-CM | POA: Diagnosis not present

## 2016-10-05 DIAGNOSIS — I2510 Atherosclerotic heart disease of native coronary artery without angina pectoris: Secondary | ICD-10-CM | POA: Diagnosis not present

## 2016-11-17 DIAGNOSIS — L923 Foreign body granuloma of the skin and subcutaneous tissue: Secondary | ICD-10-CM | POA: Diagnosis not present

## 2016-11-17 DIAGNOSIS — D485 Neoplasm of uncertain behavior of skin: Secondary | ICD-10-CM | POA: Diagnosis not present

## 2016-11-17 DIAGNOSIS — L570 Actinic keratosis: Secondary | ICD-10-CM | POA: Diagnosis not present

## 2016-11-17 DIAGNOSIS — Z189 Retained foreign body fragments, unspecified material: Secondary | ICD-10-CM | POA: Diagnosis not present

## 2016-12-28 ENCOUNTER — Telehealth: Payer: Self-pay | Admitting: Cardiovascular Disease

## 2016-12-28 ENCOUNTER — Encounter: Payer: Self-pay | Admitting: Cardiovascular Disease

## 2016-12-28 DIAGNOSIS — I25118 Atherosclerotic heart disease of native coronary artery with other forms of angina pectoris: Secondary | ICD-10-CM

## 2016-12-28 DIAGNOSIS — I208 Other forms of angina pectoris: Secondary | ICD-10-CM

## 2016-12-28 DIAGNOSIS — I10 Essential (primary) hypertension: Secondary | ICD-10-CM

## 2016-12-28 DIAGNOSIS — I2510 Atherosclerotic heart disease of native coronary artery without angina pectoris: Secondary | ICD-10-CM | POA: Insufficient documentation

## 2016-12-28 DIAGNOSIS — E7800 Pure hypercholesterolemia, unspecified: Secondary | ICD-10-CM | POA: Insufficient documentation

## 2016-12-28 DIAGNOSIS — Z01812 Encounter for preprocedural laboratory examination: Secondary | ICD-10-CM

## 2016-12-28 DIAGNOSIS — I9589 Other hypotension: Secondary | ICD-10-CM

## 2016-12-28 DIAGNOSIS — Q631 Lobulated, fused and horseshoe kidney: Secondary | ICD-10-CM

## 2016-12-28 DIAGNOSIS — I493 Ventricular premature depolarization: Secondary | ICD-10-CM | POA: Insufficient documentation

## 2016-12-28 NOTE — Telephone Encounter (Signed)
See documentation note from Dr Bertrum Sol for details.  Returned call to patient. Notified him that cath was scheduled for 01/04/2017 at 10:30a (with an arrival time of 8a) with Dr Ezzie Dural. Patient advised to come on Friday for blood work. Will also review additional instructions at that time.  Patient verbalized understanding and agreed with plan.  Pre-cath labs ordered.

## 2016-12-28 NOTE — Telephone Encounter (Signed)
Returning your call. °

## 2016-12-28 NOTE — Progress Notes (Signed)
Cardiology Office Note:    Date:  12/28/2016   ID:  Steven Hamilton [birth year], MRN [id number]  PCP:  Steven Pretty, MD  Cardiologist:  Steven Klein, MD    Referring MD: No ref. provider found   Chief Complaint  Patient presents with  . Heart Problem  Exertional dyspnea, nausea and hypotension  History of Present Illness:    Steven Hamilton is a 61 y.o. male with a hx of Hypertension, exercise induced PVCs (probably outflow tract origin) and coronary stenoses demonstrated by CT angiography. He has recently developed worsening exercise tolerance. When trying to mow his lawn, he developed extreme fatigue, breathlessness, nausea and near syncope. He noticed that his systolic blood pressure was 80 mmHg. After several minutes of rest his symptoms resolved, but recurred when he tried to start working again. This has happened repeatedly over the last few weeks. Heat is clearly making the matters worse. As before, he does not describe clear-cut angina, but does have an uncomfortable sensation of nausea. There has been a steady decline in exertional tolerance over the last couple of years. This initially appeared to improve after treatment of hypertension, but is now progressing. He is to be very fit and active, but is no longer able to exercise due to fatigue and breathlessness. He has lost substantial weight and is following a very healthy diet.  Lat year he exercised for 13 min on the Bruce protocol and had a flurry of PVCs at peak effort, consistent with Outflow tract PVCs. He did not tolerate beta blockers due to severe bradycardia. He felt better after verapamil therapy.  His antihypertensive medications were recently changed (inadvertently). He was previously taking lisinopril 20 mg once daily and hydrochlorothiazide 12.5 mg once daily. He was changed to a combination tablet for convenience, but the dose was higher at 20/25 mg daily.  06/25/2016 Coronary CT angio 1. Coronary artery calcium  score 728 Agatston units, placing the patient in the 92nd percentile for age and gender. This suggests high risk for future cardiac events. 2.  Around 50% stenosis in the proximal RCA. 3.  Suspect no more than mild disease in the LAD. 4. Possible moderate stenosis in the proximal LCx, difficult to tell for sure given extensive calcified plaque (may appear worse than it is because of blooming artifact).  02/12/2016 ECHO - Left ventricle: The cavity size was normal. Wall thickness was   increased in a pattern of mild LVH. Systolic function was normal.   The estimated ejection fraction was in the range of 55% to 60%.   Wall motion was normal; there were no regional wall motion   abnormalities. Left ventricular diastolic function parameters   were normal. - Aortic valve: There was mild regurgitation. - Mitral valve: There was mild regurgitation.  06/11/2016 Treadmill ECG stress test  Blood pressure demonstrated a hypertensive response to exercise.  ST segment elevation was noted during stress in the aVL leads, beginning at 9 minutes of stress.  ST segment depression was noted in leads V4, V5 and V6 in the last minute of exercise.  Stress test concerning for ischemia.  My evaluation: Exercise tolerance is virtually identical with 2012 when he was able to exercise for just over 13 minutes. Has hypertension at baseline and hypertensive response to exercise. Borderline significant ST segment depression noted at peak exercise, possibly a sign of ischemia. The more striking abnormality is exercise-induced monomorphic, monophasic PVCs, possibly outflow tract origin (vertical axis with high amplitude in inferior leads, transition  V2-V3). He was intolerant of higher doses of beta blocker due to resting bradycardia and severe impairment in exercise ability. Normal LV function, no complaints of angina. Consider reevaluation of kidney abnormality (crossed fused ectopia, Maybe distortion of arterial  supply?).    Past Medical History:  Diagnosis Date  . Hypertension        Crossed fused ectopia of the kidneys.  Past Surgical History:  Procedure Laterality Date  . HERNIA REPAIR      Current Medications:   Lisinopril 20 mg/hydrochlorothiazide 25 mg once daily. Verapamil sustained release 120 mg daily   Allergies:   Patient has no known allergies.   Social History   Social History  . Marital status: Married    Spouse name: N/A  . Number of children: N/A  . Years of education: N/A   Social History Main Topics  . Smoking status: Current Some Day Smoker    Types: Cigars  . Smokeless tobacco: Never Used  . Alcohol use 6.0 oz/week    10 Glasses of wine per week  . Drug use: No  . Sexual activity: Not on file   Other Topics Concern  . Not on file   Social History Narrative  . No narrative on file     Family History: The patient's family history includes Alcohol abuse in his brother; Atrial fibrillation in his mother; Cancer in his father; Hypertension in his brother and mother; Peripheral vascular disease in his brother; Prostate cancer in his father. ROS:   Please see the history of present illness.    All other systems reviewed and are negative.  EKGs/Labs/Other Studies Reviewed:     EKG:  EKG is not ordered today.   Recent Labs: March 2017 Creat 1.0. Hgb 14.5 Chol 175, TG 49, LDL115, HDL 50  CT angio abdomen: Renals: There is a small caliber left renal artery which is unchanged in the interval with no associated stenosis. There is a dominant right renal artery arising from the aorta. This renal artery is mildly tortuous but there is no stenosis identified. There is also a right renal artery arising from the right common iliac artery with no stenosis or abnormality. There is a fourth renal artery arising from the midline of the aorta just above the bifurcation with no stenosis.  Physical Exam:    VS:  BP 118/70, HR 55, RR 16  Wt Readings from  Last 3 Encounters:  05/05/16 191 lb (86.6 kg)  02/03/16 188 lb (85.3 kg)  10/09/14 185 lb (83.9 kg)     GEN:  Well nourished, well developed in no acute distress HEENT: Normal NECK: No JVD; No carotid bruits LYMPHATICS: No lymphadenopathy CARDIAC: RRR, no murmurs, rubs, gallops RESPIRATORY:  Clear to auscultation without rales, wheezing or rhonchi  ABDOMEN: Soft, non-tender, non-distended MUSCULOSKELETAL:  No edema; No deformity  SKIN: Warm and dry NEUROLOGIC:  Alert and oriented x 3 PSYCHIATRIC:  Normal affect   ASSESSMENT:    1. Essential hypertension   2. Coronary artery disease involving native coronary artery of native heart with other form of angina pectoris ([place])   3. Exertional hypotension   4. 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 Congenital fusion of kidneys   6. Hypercholesterolemia    PLAN:    In order of problems listed above:  1. CAD with exertional dyspnea and nausea: possibly an angina equivalent. I worry that he may actually have a severe stenosis or chronic occlusion of the left circumflex artery,  now symptomatic. He is already on an antianginal medication (verapamil) and he did not tolerate even tiny doses of beta blocker (bystolic 2.5 mg) due to bradycardia. Recommend coronary angiography. 2. HTN: excellent control. I asked him to take only half of the lisinopril/HCTZ combo daily. 3. Exertional hypotension: this a worrisome development and makes angiography necessary. However, it may also be explained by the increased dose of diuretic and the extremely hot weather. 4. (RV/LV)OT PVCs: not sure whether these are contributing to his exertional symptoms. If cath is OK, consider repeating a treadmill test on verapamil. If PVCs are still frequent, may benefit from EP evaluation referral for ablation. Flecainide is another option, but may be risky with CAD. Unlikely to tolerate sotalol due to bradycardia. 5. Crossed fused  ectopia of the kidneys: appears to not be clinically relevant. Normal renal function, no evidence of renal artery stenosis by recent angiography. 6. HLP: target LDL<70. He has lost a lot of weight and is eating a very healthy diet. Will request his updated lipid profile from Dr. Shelia Media.   Medication Adjustments/Labs and Tests Ordered: Current medicines are reviewed at length with the patient today.  Concerns regarding medicines are outlined above.  No orders of the defined types were placed in this encounter.  No orders of the defined types were placed in this encounter.   Signed, Steven Klein, MD  12/28/2016 11:43 AM    [facility]

## 2016-12-31 ENCOUNTER — Other Ambulatory Visit: Payer: Self-pay | Admitting: Cardiovascular Disease

## 2016-12-31 ENCOUNTER — Telehealth: Payer: Self-pay

## 2016-12-31 DIAGNOSIS — I208 Other forms of angina pectoris: Secondary | ICD-10-CM | POA: Diagnosis not present

## 2016-12-31 DIAGNOSIS — Z01812 Encounter for preprocedural laboratory examination: Secondary | ICD-10-CM | POA: Diagnosis not present

## 2016-12-31 DIAGNOSIS — I25118 Atherosclerotic heart disease of native coronary artery with other forms of angina pectoris: Secondary | ICD-10-CM | POA: Diagnosis not present

## 2016-12-31 NOTE — Telephone Encounter (Addendum)
Me - 12/31/16 8:35 AM  Note    Patient presented to the office today to discuss cath instructions and have pre-procedure blood work completed. Medications reviewed and updated.

## 2016-12-31 NOTE — Telephone Encounter (Signed)
Patient presented to the office today to discuss cath instructions. Medication list updated.

## 2017-01-01 LAB — BASIC METABOLIC PANEL
BUN / CREAT RATIO: 25 — AB (ref 10–24)
BUN: 21 mg/dL (ref 8–27)
CALCIUM: 9.4 mg/dL (ref 8.6–10.2)
CO2: 22 mmol/L (ref 20–29)
CREATININE: 0.85 mg/dL (ref 0.76–1.27)
Chloride: 98 mmol/L (ref 96–106)
GFR, EST AFRICAN AMERICAN: 109 mL/min/{1.73_m2} (ref >59)
GFR, EST NON AFRICAN AMERICAN: 95 mL/min/{1.73_m2} (ref >59)
Glucose: 107 mg/dL — ABNORMAL HIGH (ref 65–99)
Potassium: 4.4 mmol/L (ref 3.5–5.2)
Sodium: 137 mmol/L (ref 134–144)

## 2017-01-01 LAB — PROTIME-INR
INR: 1 (ref 0.8–1.2)
Prothrombin Time: 10.7 s (ref 9.1–12.0)

## 2017-01-01 LAB — CBC
HEMATOCRIT: 41.9 % (ref 37.5–51.0)
HEMOGLOBIN: 14.7 g/dL (ref 13.0–17.7)
MCH: 30.4 pg (ref 26.6–33.0)
MCHC: 35.1 g/dL (ref 31.5–35.7)
MCV: 87 fL (ref 79–97)
Platelets: 191 10*3/uL (ref 150–379)
RBC: 4.84 x10E6/uL (ref 4.14–5.80)
RDW: 14.3 % (ref 12.3–15.4)
WBC: 4.4 10*3/uL (ref 3.4–10.8)

## 2017-01-03 ENCOUNTER — Telehealth: Payer: Self-pay

## 2017-01-03 NOTE — Telephone Encounter (Signed)
Call placed to Pt.  Phone number indicated on DPR went to fax line, unable to leave a message.  Called Pt cell number listed.  Left generic message requesting call back to this nurse.

## 2017-01-03 NOTE — Telephone Encounter (Signed)
Call back received from pt: Pre-catheterization at [facility] scheduled for: 01/04/2017 at 25 Verified arrival time and place:  NT @ 0800 Confirmed AM meds to be taken pre-cath with sip of water:  Pt confirmed will take ASA prior to arrival.  Notified pt to hold lisin/hctz. Confirmed patient has responsible person to drive home post procedure and observe patient for 24 hours:  wife Addl concerns:  Pt requested clarification in regards to anesthesia.  Education given.  No further needs.

## 2017-01-04 ENCOUNTER — Ambulatory Visit (HOSPITAL_COMMUNITY)
Admission: RE | Admit: 2017-01-04 | Discharge: 2017-01-04 | Disposition: A | Discharge status: Home / Self Care | Payer: BLUE CROSS/BLUE SHIELD | Source: Ambulatory Visit | Attending: Cardiovascular Disease | Admitting: Cardiovascular Disease

## 2017-01-04 ENCOUNTER — Encounter (HOSPITAL_COMMUNITY)
Admission: RE | Disposition: A | Discharge status: Home / Self Care | Payer: Self-pay | Source: Ambulatory Visit | Attending: Cardiovascular Disease

## 2017-01-04 DIAGNOSIS — I493 Ventricular premature depolarization: Secondary | ICD-10-CM | POA: Diagnosis not present

## 2017-01-04 DIAGNOSIS — Y840 Cardiac catheterization as the cause of abnormal reaction of the patient, or of later complication, without mention of misadventure at the time of the procedure: Secondary | ICD-10-CM | POA: Insufficient documentation

## 2017-01-04 DIAGNOSIS — I2510 Atherosclerotic heart disease of native coronary artery without angina pectoris: Secondary | ICD-10-CM | POA: Diagnosis not present

## 2017-01-04 DIAGNOSIS — I97790 Other intraoperative cardiac functional disturbances during cardiac surgery: Secondary | ICD-10-CM | POA: Diagnosis not present

## 2017-01-04 DIAGNOSIS — I2584 Coronary atherosclerosis due to calcified coronary lesion: Secondary | ICD-10-CM | POA: Diagnosis not present

## 2017-01-04 DIAGNOSIS — I10 Essential (primary) hypertension: Secondary | ICD-10-CM | POA: Insufficient documentation

## 2017-01-04 DIAGNOSIS — I9589 Other hypotension: Secondary | ICD-10-CM | POA: Insufficient documentation

## 2017-01-04 DIAGNOSIS — I25118 Atherosclerotic heart disease of native coronary artery with other forms of angina pectoris: Secondary | ICD-10-CM | POA: Insufficient documentation

## 2017-01-04 DIAGNOSIS — E7800 Pure hypercholesterolemia, unspecified: Secondary | ICD-10-CM | POA: Insufficient documentation

## 2017-01-04 DIAGNOSIS — F17290 Nicotine dependence, other tobacco product, uncomplicated: Secondary | ICD-10-CM | POA: Insufficient documentation

## 2017-01-04 DIAGNOSIS — I208 Other forms of angina pectoris: Secondary | ICD-10-CM

## 2017-01-04 HISTORY — PX: LEFT HEART CATH AND CORONARY ANGIOGRAPHY: CATH118249

## 2017-01-04 SURGERY — LEFT HEART CATH AND CORONARY ANGIOGRAPHY
Anesthesia: LOCAL

## 2017-01-04 MED ORDER — MIDAZOLAM HCL 2 MG/2ML IJ SOLN
INTRAMUSCULAR | Status: DC | PRN
  Administered 2017-01-04: 2 mg via INTRAVENOUS

## 2017-01-04 MED ORDER — SODIUM CHLORIDE 0.9 % IV SOLN
INTRAVENOUS | Status: DC
  Administered 2017-01-04: 09:00:00 via INTRAVENOUS

## 2017-01-04 MED ORDER — VERAPAMIL HCL 2.5 MG/ML IV SOLN
INTRAVENOUS | Status: AC
  Filled 2017-01-04: qty 2

## 2017-01-04 MED ORDER — ACETAMINOPHEN 325 MG PO TABS: 650.0000 mg | ORAL_TABLET | ORAL | Status: DC | PRN

## 2017-01-04 MED ORDER — SODIUM CHLORIDE 0.9% FLUSH: 3.0000 mL | Freq: Two times a day (BID) | INTRAVENOUS | Status: DC

## 2017-01-04 MED ORDER — MIDAZOLAM HCL 2 MG/2ML IJ SOLN
INTRAMUSCULAR | Status: AC
  Filled 2017-01-04: qty 2

## 2017-01-04 MED ORDER — HEPARIN SODIUM (PORCINE) 1000 UNIT/ML IJ SOLN
INTRAMUSCULAR | Status: AC
  Filled 2017-01-04: qty 1

## 2017-01-04 MED ORDER — FENTANYL CITRATE (PF) 100 MCG/2ML IJ SOLN
INTRAMUSCULAR | Status: DC | PRN
Start: 2017-01-04 — End: 2017-01-04
  Administered 2017-01-04: 50 ug via INTRAVENOUS

## 2017-01-04 MED ORDER — HEPARIN (PORCINE) IN NACL 2-0.9 UNIT/ML-% IJ SOLN
INTRAMUSCULAR | Status: AC | PRN
  Administered 2017-01-04: 1000 mL

## 2017-01-04 MED ORDER — LIDOCAINE HCL (PF) 1 % IJ SOLN
INTRAMUSCULAR | Status: DC | PRN
  Administered 2017-01-04: 2 mL

## 2017-01-04 MED ORDER — HEPARIN (PORCINE) IN NACL 2-0.9 UNIT/ML-% IJ SOLN
INTRAMUSCULAR | Status: AC
  Filled 2017-01-04: qty 1000

## 2017-01-04 MED ORDER — LIDOCAINE HCL 1 % IJ SOLN
INTRAMUSCULAR | Status: AC
  Filled 2017-01-04: qty 20

## 2017-01-04 MED ORDER — SODIUM CHLORIDE 0.9 % WEIGHT BASED INFUSION: 1.0000 mL/kg/h | INTRAVENOUS | Status: DC

## 2017-01-04 MED ORDER — VERAPAMIL HCL 2.5 MG/ML IV SOLN
INTRAVENOUS | Status: DC | PRN
  Administered 2017-01-04: 10 mL via INTRA_ARTERIAL

## 2017-01-04 MED ORDER — ASPIRIN 81 MG PO CHEW: 81.0000 mg | CHEWABLE_TABLET | ORAL | Status: DC

## 2017-01-04 MED ORDER — ONDANSETRON HCL 4 MG/2ML IJ SOLN: 4.0000 mg | Freq: Four times a day (QID) | INTRAMUSCULAR | Status: DC | PRN

## 2017-01-04 MED ORDER — IOPAMIDOL (ISOVUE-370) INJECTION 76%
INTRAVENOUS | Status: DC | PRN
  Administered 2017-01-04: 80 mL via INTRA_ARTERIAL

## 2017-01-04 MED ORDER — FENTANYL CITRATE (PF) 100 MCG/2ML IJ SOLN
INTRAMUSCULAR | Status: AC
  Filled 2017-01-04: qty 2

## 2017-01-04 MED ORDER — SODIUM CHLORIDE 0.9% FLUSH: 3.0000 mL | INTRAVENOUS | Status: DC | PRN

## 2017-01-04 MED ORDER — IOPAMIDOL (ISOVUE-370) INJECTION 76%
INTRAVENOUS | Status: AC
  Filled 2017-01-04: qty 100

## 2017-01-04 MED ORDER — HEPARIN SODIUM (PORCINE) 1000 UNIT/ML IJ SOLN
INTRAMUSCULAR | Status: DC | PRN
  Administered 2017-01-04: 5000 [IU] via INTRAVENOUS

## 2017-01-04 MED ORDER — SODIUM CHLORIDE 0.9 % IV SOLN: 250.0000 mL | INTRAVENOUS | Status: DC | PRN

## 2017-01-04 SURGICAL SUPPLY — 12 items
CATH EXPO 5F FL3.5 (CATHETERS) ×2 IMPLANT
CATH EXPO 5FR FR4 (CATHETERS) ×2 IMPLANT
CATH INFINITI 5FR ANG PIGTAIL (CATHETERS) ×2 IMPLANT
DEVICE RAD COMP TR BAND LRG (VASCULAR PRODUCTS) ×2 IMPLANT
GLIDESHEATH SLEND SS 6F .021 (SHEATH) ×2 IMPLANT
GUIDEWIRE INQWIRE 1.5J.035X260 (WIRE) ×1 IMPLANT
INQWIRE 1.5J .035X260CM (WIRE) ×2
KIT HEART LEFT (KITS) ×2 IMPLANT
PACK CARDIAC CATHETERIZATION (CUSTOM PROCEDURE TRAY) ×2 IMPLANT
SYR MEDRAD MARK V 150ML (SYRINGE) ×2 IMPLANT
TRANSDUCER W/STOPCOCK (MISCELLANEOUS) ×2 IMPLANT
TUBING CIL FLEX 10 FLL-RA (TUBING) ×2 IMPLANT

## 2017-01-04 NOTE — H&P (View-Only) (Signed)
Cardiology Office Note:    Date:  12/28/2016   ID:  Steven, Hamilton [birth year], MRN [id number]  PCP:  Deland Pretty, MD  Cardiologist:  Sanda Klein, MD    Referring MD: No ref. provider found   Chief Complaint  Patient presents with  . Heart Problem  Exertional dyspnea, nausea and hypotension  History of Present Illness:    Steven Hamilton is a 61 y.o. male with a hx of Hypertension, exercise induced PVCs (probably outflow tract origin) and coronary stenoses demonstrated by CT angiography. He has recently developed worsening exercise tolerance. When trying to mow his lawn, he developed extreme fatigue, breathlessness, nausea and near syncope. He noticed that his systolic blood pressure was 80 mmHg. After several minutes of rest his symptoms resolved, but recurred when he tried to start working again. This has happened repeatedly over the last few weeks. Heat is clearly making the matters worse. As before, he does not describe clear-cut angina, but does have an uncomfortable sensation of nausea. There has been a steady decline in exertional tolerance over the last couple of years. This initially appeared to improve after treatment of hypertension, but is now progressing. He is to be very fit and active, but is no longer able to exercise due to fatigue and breathlessness. He has lost substantial weight and is following a very healthy diet.  Lat year he exercised for 13 min on the Bruce protocol and had a flurry of PVCs at peak effort, consistent with Outflow tract PVCs. He did not tolerate beta blockers due to severe bradycardia. He felt better after verapamil therapy.  His antihypertensive medications were recently changed (inadvertently). He was previously taking lisinopril 20 mg once daily and hydrochlorothiazide 12.5 mg once daily. He was changed to a combination tablet for convenience, but the dose was higher at 20/25 mg daily.  06/25/2016 Coronary CT angio 1. Coronary artery calcium  score 728 Agatston units, placing the patient in the 92nd percentile for age and gender. This suggests high risk for future cardiac events. 2.  Around 50% stenosis in the proximal RCA. 3.  Suspect no more than mild disease in the LAD. 4. Possible moderate stenosis in the proximal LCx, difficult to tell for sure given extensive calcified plaque (may appear worse than it is because of blooming artifact).  02/12/2016 ECHO - Left ventricle: The cavity size was normal. Wall thickness was   increased in a pattern of mild LVH. Systolic function was normal.   The estimated ejection fraction was in the range of 55% to 60%.   Wall motion was normal; there were no regional wall motion   abnormalities. Left ventricular diastolic function parameters   were normal. - Aortic valve: There was mild regurgitation. - Mitral valve: There was mild regurgitation.  06/11/2016 Treadmill ECG stress test  Blood pressure demonstrated a hypertensive response to exercise.  ST segment elevation was noted during stress in the aVL leads, beginning at 9 minutes of stress.  ST segment depression was noted in leads V4, V5 and V6 in the last minute of exercise.  Stress test concerning for ischemia.  My evaluation: Exercise tolerance is virtually identical with 2012 when he was able to exercise for just over 13 minutes. Has hypertension at baseline and hypertensive response to exercise. Borderline significant ST segment depression noted at peak exercise, possibly a sign of ischemia. The more striking abnormality is exercise-induced monomorphic, monophasic PVCs, possibly outflow tract origin (vertical axis with high amplitude in inferior leads, transition  V2-V3). He was intolerant of higher doses of beta blocker due to resting bradycardia and severe impairment in exercise ability. Normal LV function, no complaints of angina. Consider reevaluation of kidney abnormality (crossed fused ectopia, Maybe distortion of arterial  supply?).    Past Medical History:  Diagnosis Date  . Hypertension        Crossed fused ectopia of the kidneys.  Past Surgical History:  Procedure Laterality Date  . HERNIA REPAIR      Current Medications:   Lisinopril 20 mg/hydrochlorothiazide 25 mg once daily. Verapamil sustained release 120 mg daily   Allergies:   Patient has no known allergies.   Social History   Social History  . Marital status: Married    Spouse name: N/A  . Number of children: N/A  . Years of education: N/A   Social History Main Topics  . Smoking status: Current Some Day Smoker    Types: Cigars  . Smokeless tobacco: Never Used  . Alcohol use 6.0 oz/week    10 Glasses of wine per week  . Drug use: No  . Sexual activity: Not on file   Other Topics Concern  . Not on file   Social History Narrative  . No narrative on file     Family History: The patient's family history includes Alcohol abuse in his brother; Atrial fibrillation in his mother; Cancer in his father; Hypertension in his brother and mother; Peripheral vascular disease in his brother; Prostate cancer in his father. ROS:   Please see the history of present illness.    All other systems reviewed and are negative.  EKGs/Labs/Other Studies Reviewed:     EKG:  EKG is not ordered today.   Recent Labs: March 2017 Creat 1.0. Hgb 14.5 Chol 175, TG 49, LDL115, HDL 50  CT angio abdomen: Renals: There is a small caliber left renal artery which is unchanged in the interval with no associated stenosis. There is a dominant right renal artery arising from the aorta. This renal artery is mildly tortuous but there is no stenosis identified. There is also a right renal artery arising from the right common iliac artery with no stenosis or abnormality. There is a fourth renal artery arising from the midline of the aorta just above the bifurcation with no stenosis.  Physical Exam:    VS:  BP 118/70, HR 55, RR 16  Wt Readings from  Last 3 Encounters:  05/05/16 191 lb (86.6 kg)  02/03/16 188 lb (85.3 kg)  10/09/14 185 lb (83.9 kg)     GEN:  Well nourished, well developed in no acute distress HEENT: Normal NECK: No JVD; No carotid bruits LYMPHATICS: No lymphadenopathy CARDIAC: RRR, no murmurs, rubs, gallops RESPIRATORY:  Clear to auscultation without rales, wheezing or rhonchi  ABDOMEN: Soft, non-tender, non-distended MUSCULOSKELETAL:  No edema; No deformity  SKIN: Warm and dry NEUROLOGIC:  Alert and oriented x 3 PSYCHIATRIC:  Normal affect   ASSESSMENT:    1. Essential hypertension   2. Coronary artery disease involving native coronary artery of native heart with other form of angina pectoris ([place])   3. Exertional hypotension   4. 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 Congenital fusion of kidneys   6. Hypercholesterolemia    PLAN:    In order of problems listed above:  1. CAD with exertional dyspnea and nausea: possibly an angina equivalent. I worry that he may actually have a severe stenosis or chronic occlusion of the left circumflex artery,  now symptomatic. He is already on an antianginal medication (verapamil) and he did not tolerate even tiny doses of beta blocker (bystolic 2.5 mg) due to bradycardia. Recommend coronary angiography. 2. HTN: excellent control. I asked him to take only half of the lisinopril/HCTZ combo daily. 3. Exertional hypotension: this a worrisome development and makes angiography necessary. However, it may also be explained by the increased dose of diuretic and the extremely hot weather. 4. (RV/LV)OT PVCs: not sure whether these are contributing to his exertional symptoms. If cath is OK, consider repeating a treadmill test on verapamil. If PVCs are still frequent, may benefit from EP evaluation referral for ablation. Flecainide is another option, but may be risky with CAD. Unlikely to tolerate sotalol due to bradycardia. 5. Crossed fused  ectopia of the kidneys: appears to not be clinically relevant. Normal renal function, no evidence of renal artery stenosis by recent angiography. 6. HLP: target LDL<70. He has lost a lot of weight and is eating a very healthy diet. Will request his updated lipid profile from Dr. Shelia Media.   Medication Adjustments/Labs and Tests Ordered: Current medicines are reviewed at length with the patient today.  Concerns regarding medicines are outlined above.  No orders of the defined types were placed in this encounter.  No orders of the defined types were placed in this encounter.   Signed, Sanda Klein, MD  12/28/2016 11:43 AM    [facility]

## 2017-01-04 NOTE — Interval H&P Note (Signed)
History and Physical Interval Note:  01/04/2017 10:24 AM  Steven Hamilton  has presented today for surgery, with the diagnosis of cad with excertional angina  The various methods of treatment have been discussed with the patient and family. After consideration of risks, benefits and other options for treatment, the patient has consented to  Procedure(s): Left Heart Cath and Coronary Angiography (N/A) as a surgical intervention .  The patient's history has been reviewed, patient examined, no change in status, stable for surgery.  I have reviewed the patient's chart and labs.  Questions were answered to the patient's satisfaction.     [name]

## 2017-01-04 NOTE — Discharge Instructions (Signed)

## 2017-01-05 ENCOUNTER — Encounter (HOSPITAL_COMMUNITY): Payer: Self-pay | Admitting: Cardiovascular Disease

## 2017-02-14 DIAGNOSIS — D1801 Hemangioma of skin and subcutaneous tissue: Secondary | ICD-10-CM | POA: Diagnosis not present

## 2017-02-14 DIAGNOSIS — L821 Other seborrheic keratosis: Secondary | ICD-10-CM | POA: Diagnosis not present

## 2017-02-14 DIAGNOSIS — D3617 Benign neoplasm of peripheral nerves and autonomic nervous system of trunk, unspecified: Secondary | ICD-10-CM | POA: Diagnosis not present

## 2017-02-14 DIAGNOSIS — L570 Actinic keratosis: Secondary | ICD-10-CM | POA: Diagnosis not present

## 2017-02-14 DIAGNOSIS — D171 Benign lipomatous neoplasm of skin and subcutaneous tissue of trunk: Secondary | ICD-10-CM | POA: Diagnosis not present

## 2017-02-18 ENCOUNTER — Telehealth: Payer: Self-pay

## 2017-02-18 MED ORDER — LISINOPRIL-HYDROCHLOROTHIAZIDE 20-12.5 MG PO TABS
1.0000 | ORAL_TABLET | Freq: Every day | ORAL | 3 refills | Status: DC
Start: 2017-02-18 — End: 2018-02-20

## 2017-02-18 NOTE — Telephone Encounter (Signed)
Rx(s) sent to pharmacy electronically.  

## 2017-02-18 NOTE — Telephone Encounter (Signed)
-----   Message from Sanda Klein, MD sent at 02/14/2017 12:35 PM EDT ----- Please send in Rx for Lisinopril 20/ HCTZ 12.5, #90, RF 3. Thanks [employer]

## 2017-06-02 ENCOUNTER — Encounter: Payer: Self-pay | Admitting: Cardiovascular Disease

## 2017-06-02 ENCOUNTER — Ambulatory Visit: Payer: BLUE CROSS/BLUE SHIELD | Admitting: Cardiovascular Disease

## 2017-06-02 VITALS — BP 139/81 | HR 59 | Ht 71.0 in | Wt 190.2 lb

## 2017-06-02 DIAGNOSIS — I2510 Atherosclerotic heart disease of native coronary artery without angina pectoris: Secondary | ICD-10-CM

## 2017-06-02 DIAGNOSIS — I493 Ventricular premature depolarization: Secondary | ICD-10-CM | POA: Diagnosis not present

## 2017-06-02 DIAGNOSIS — Q631 Lobulated, fused and horseshoe kidney: Secondary | ICD-10-CM

## 2017-06-02 DIAGNOSIS — I10 Essential (primary) hypertension: Secondary | ICD-10-CM

## 2017-06-02 NOTE — Patient Instructions (Signed)
Dr Croitoru recommends that you schedule a follow-up appointment in 12 months. You will receive a reminder letter in the mail two months in advance. If you don't receive a letter, please call our office to schedule the follow-up appointment.  If you need a refill on your cardiac medications before your next appointment, please call your pharmacy. 

## 2017-06-02 NOTE — Progress Notes (Addendum)
Cardiology Office Note    Date:  06/06/2017   ID:  Steven Hamilton Hamilton 10/11/1955, MRN [id number]  PCP:  Deland Pretty, MD  Cardiologist:   Sanda Klein, MD   Chief complaint: HTN, palpitations, cath follow up  History of Present Illness:  Steven Hamilton Hamilton is a 61 y.o. male with essential hypertension who presents For follow-up after cardiac cath   Palpitations rarely bother him. He has verapamil responsive PVCsHe did not tolerate higher doses of beta blocker due to fatigue. Over the summer he had severe weak spells, probably due to a higher dose of diuretic. Chest CT showed substantial coronary calcification, but his heart cath did not show hemodynamically important stenoses.  He feels well. He is exercising regularly and does a lot of yard work. At home, BP is consistently under 130/80.  He had a normal treadmill stress test in 2012 (with hypertensive response to exercise). In 2014, CT showed "Kidneys demonstrate congenital crossed fused ectopia".  Had labs with Dr. Deland Pretty in March 2017. Hemoglobin 14.6, creatinine 1.0, normal liver function tests, total cholesterol 175, triglycerides 49, HDL 50, LDL 115 bland urinalysis  Past Medical History:  Diagnosis Date  . Hypertension     Past Surgical History:  Procedure Laterality Date  . HERNIA REPAIR    . LEFT HEART CATH AND CORONARY ANGIOGRAPHY N/A 01/04/2017   Procedure: Left Heart Cath and Coronary Angiography;  Surgeon: [name], MD;  Location: [facility];  Service: Cardiovascular;  Laterality: N/A;    Current Medications: Outpatient Medications Prior to Visit  Medication Sig Dispense Refill  . aspirin EC 81 MG tablet Take 81 mg by mouth daily.    Marland Kitchen lisinopril-hydrochlorothiazide (PRINZIDE,ZESTORETIC) 20-12.5 MG tablet Take 1 tablet by mouth daily. 90 tablet 3  . psyllium (METAMUCIL) 58.6 % powder Take 1 packet by mouth daily.    . rosuvastatin (CRESTOR) 20 MG tablet Take 10 mg by mouth at bedtime.   3    . verapamil (CALAN-SR) 120 MG CR tablet Take 1 tablet (120 mg total) by mouth at bedtime. 30 tablet 11   No facility-administered medications prior to visit.      Allergies:   Patient has no known allergies.   Social History   Socioeconomic History  . Marital status: Married    Spouse name: None  . Number of children: None  . Years of education: None  . Highest education level: None  Social Needs  . Financial resource strain: None  . Food insecurity - worry: None  . Food insecurity - inability: None  . Transportation needs - medical: None  . Transportation needs - non-medical: None  Occupational History  . None  Tobacco Use  . Smoking status: Current Some Day Smoker    Types: Cigars  . Smokeless tobacco: Never Used  Substance and Sexual Activity  . Alcohol use: Yes    Alcohol/week: 6.0 oz    Types: 10 Glasses of wine per week  . Drug use: No  . Sexual activity: None  Other Topics Concern  . None  Social History Narrative  . None     Family History:  The patient's family history includes Alcohol abuse in his brother; Atrial fibrillation in his mother; Cancer in his father; Hypertension in his brother and mother; Peripheral vascular disease in his brother; Prostate cancer in his father.   ROS:   Please see the history of present illness.    ROS All other systems reviewed and are negative.  PHYSICAL EXAM:   VS:  BP 139/81   Pulse (!) 59   Ht 5\' 11"  (1.803 m)   Wt 190 lb 3.2 oz (86.3 kg)   BMI 26.53 kg/m    Blood pressure rechecked 20 minutes later was unchanged GEN: Well nourished, well developed, in no acute distress  HEENT: normal  Neck: no JVD, carotid bruits, or masses Cardiac: RRR; no murmurs, rubs, or gallops,no edema , worse prominent respiratory arrhythmia consistent with high vagal tone Respiratory:  clear to auscultation bilaterally, normal work of breathing GI: soft, nontender, nondistended, + BS MS: no deformity or atrophy  Skin: warm and dry,  no rash Neuro:  Alert and Oriented x 3, Strength and sensation are intact Psych: euthymic mood, full affect  Wt Readings from Last 3 Encounters:  06/02/17 190 lb 3.2 oz (86.3 kg)  01/04/17 189 lb (85.7 kg)  05/05/16 191 lb (86.6 kg)      Studies/Labs Reviewed:   CATH 01/04/2017  Conclusion   1. Mild nonobstructive CAD with diffuse coronary calcification 2. Normal/vigorous LV systolic function  Recommend: medical therapy for nonobstructive CAD (pt taking ASA/statin)  Note: frequent PVC's during procedure   Diagnostic Diagram          EKG:  EKG is ordered today.  The ekg ordered today demonstrates Normal sinus rhythm with incomplete right bundle branch block  Recent Labs: labs with Dr. Deland Pretty in March 2017. Hemoglobin 14.6, creatinine 1.0, normal liver function tests,   Lipid Panel: total cholesterol 175, TG 49, HDL 50, LDL 115 bland urinalysis ASSESSMENT:    1. Essential hypertension   2. Coronary artery disease involving native coronary artery of native heart without angina pectoris   3. 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 Congenital fusion of kidneys      PLAN:  In order of problems listed above:  1. HTN: good control. Did not tolerate beta blockers or higher thiazide doses. 2. CAD: asymptomatic, minor lesions by angiography. 92nd percentile Agatston score. On highly effective statin. 3. PVCs: currently asymptomatic 4. 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   Medication Adjustments/Labs and Tests Ordered: Current medicines are reviewed at length with the patient today.  Concerns regarding medicines are outlined above.  Medication changes, Labs and Tests ordered today are listed in the Patient Instructions below. Patient Instructions  Dr Sallyanne Kuster recommends that you schedule a follow-up appointment in 12 months. You will receive a reminder letter  in the mail two months in advance. If you don't receive a letter, please call our office to schedule the follow-up appointment.  If you need a refill on your cardiac medications before your next appointment, please call your pharmacy.    Signed, Sanda Klein, MD  06/06/2017 10:38 AM    [facility] [place], [place],   [address] Phone: [phone]; Fax: [phone]

## 2017-06-05 ENCOUNTER — Encounter: Payer: Self-pay | Admitting: Cardiovascular Disease

## 2017-06-22 ENCOUNTER — Other Ambulatory Visit: Payer: Self-pay | Admitting: Cardiovascular Disease

## 2017-07-11 DIAGNOSIS — N410 Acute prostatitis: Secondary | ICD-10-CM | POA: Diagnosis not present

## 2017-09-08 DIAGNOSIS — Z125 Encounter for screening for malignant neoplasm of prostate: Secondary | ICD-10-CM | POA: Diagnosis not present

## 2017-09-08 DIAGNOSIS — I10 Essential (primary) hypertension: Secondary | ICD-10-CM | POA: Diagnosis not present

## 2017-09-14 DIAGNOSIS — M542 Cervicalgia: Secondary | ICD-10-CM | POA: Diagnosis not present

## 2017-09-14 DIAGNOSIS — Z0000 Encounter for general adult medical examination without abnormal findings: Secondary | ICD-10-CM | POA: Diagnosis not present

## 2017-09-19 DIAGNOSIS — M542 Cervicalgia: Secondary | ICD-10-CM | POA: Diagnosis not present

## 2017-09-26 DIAGNOSIS — M542 Cervicalgia: Secondary | ICD-10-CM | POA: Diagnosis not present

## 2018-02-14 DIAGNOSIS — D225 Melanocytic nevi of trunk: Secondary | ICD-10-CM | POA: Diagnosis not present

## 2018-02-14 DIAGNOSIS — L821 Other seborrheic keratosis: Secondary | ICD-10-CM | POA: Diagnosis not present

## 2018-02-14 DIAGNOSIS — D485 Neoplasm of uncertain behavior of skin: Secondary | ICD-10-CM | POA: Diagnosis not present

## 2018-02-14 DIAGNOSIS — D1801 Hemangioma of skin and subcutaneous tissue: Secondary | ICD-10-CM | POA: Diagnosis not present

## 2018-02-14 DIAGNOSIS — L814 Other melanin hyperpigmentation: Secondary | ICD-10-CM | POA: Diagnosis not present

## 2018-02-20 ENCOUNTER — Other Ambulatory Visit: Payer: Self-pay | Admitting: Cardiovascular Disease

## 2018-05-20 ENCOUNTER — Other Ambulatory Visit: Payer: Self-pay | Admitting: Cardiovascular Disease

## 2018-07-11 DIAGNOSIS — Z23 Encounter for immunization: Secondary | ICD-10-CM | POA: Diagnosis not present

## 2018-07-12 ENCOUNTER — Other Ambulatory Visit: Payer: Self-pay | Admitting: Cardiovascular Disease

## 2018-07-12 NOTE — Telephone Encounter (Signed)
Rx request sent to pharmacy.  

## 2018-08-15 ENCOUNTER — Other Ambulatory Visit: Payer: Self-pay | Admitting: Cardiovascular Disease

## 2018-08-18 ENCOUNTER — Ambulatory Visit (INDEPENDENT_AMBULATORY_CARE_PROVIDER_SITE_OTHER): Payer: 59 | Admitting: Cardiovascular Disease

## 2018-08-18 ENCOUNTER — Encounter: Payer: Self-pay | Admitting: Cardiovascular Disease

## 2018-08-18 VITALS — BP 128/74 | HR 71 | Ht 71.0 in | Wt 194.0 lb

## 2018-08-18 DIAGNOSIS — Q631 Lobulated, fused and horseshoe kidney: Secondary | ICD-10-CM

## 2018-08-18 DIAGNOSIS — I2510 Atherosclerotic heart disease of native coronary artery without angina pectoris: Secondary | ICD-10-CM

## 2018-08-18 DIAGNOSIS — I10 Essential (primary) hypertension: Secondary | ICD-10-CM | POA: Diagnosis not present

## 2018-08-18 DIAGNOSIS — E7800 Pure hypercholesterolemia, unspecified: Secondary | ICD-10-CM

## 2018-08-18 DIAGNOSIS — I493 Ventricular premature depolarization: Secondary | ICD-10-CM

## 2018-08-18 MED ORDER — BENZONATATE 100 MG PO CAPS: 100.0000 mg | ORAL_CAPSULE | Freq: Three times a day (TID) | ORAL | 0 refills | Status: DC | PRN

## 2018-08-18 NOTE — Progress Notes (Addendum)
Cardiology Office Note    Date:  08/19/2018   ID:  Brenan, [place] Nov 16, 1955, MRN [id number]  PCP:  Deland Pretty, MD  Cardiologist:   Sanda Klein, MD   Chief complaint: HTN, palpitations, cath follow up  History of Present Illness:  Steven Hamilton is a 63 y.o. male with essential hypertension, verapamil-responsive PVCs, coronary atherosclerosis without significant stenoses, hyperlipidemia who presents for follow-up.  Been doing quite well.  He denies any major cardiovascular complaints.  He continues to exercise regularly and is physically fit.  He went hiking in the mountains last weekend without any difficulty.  Inadvertently, he forgot his verapamil and did not take it for 2 days.  He thinks he felt better without it.  He has not been recently troubled by palpitations.  He had the flu about a month ago and has a persistent dry cough, although all the other complaints have resolved.  He had verapamil responsive PVCs and did not tolerate treatment with a beta blocker due to fatigue.  Checks his blood pressure periodically at home is consistently in normal range.  He had a normal treadmill stress test in 2012 (with hypertensive response to exercise). In 2014, CT showed "Kidneys demonstrate congenital crossed fused ectopia".  His labs are followed with Dr. Deland Pretty.  Roughly 1 year ago his LDL cholesterol was 68  Past Medical History:  Diagnosis Date  . Hypertension     Past Surgical History:  Procedure Laterality Date  . HERNIA REPAIR    . LEFT HEART CATH AND CORONARY ANGIOGRAPHY N/A 01/04/2017   Procedure: Left Heart Cath and Coronary Angiography;  Surgeon: [name], MD;  Location: [facility];  Service: Cardiovascular;  Laterality: N/A;    Current Medications: Outpatient Medications Prior to Visit  Medication Sig Dispense Refill  . aspirin EC 81 MG tablet Take 81 mg by mouth daily.    Marland Kitchen lisinopril-hydrochlorothiazide (PRINZIDE,ZESTORETIC) 20-12.5  MG tablet TAKE 1 TABLET ONCE DAILY. 30 tablet 3  . lisinopril-hydrochlorothiazide (PRINZIDE,ZESTORETIC) 20-12.5 MG tablet Take 1 tablet by mouth daily. NEED OV. 30 tablet 1  . lisinopril-hydrochlorothiazide (PRINZIDE,ZESTORETIC) 20-12.5 MG tablet TAKE 1 TABLET ONCE DAILY. 30 tablet 0  . psyllium (METAMUCIL) 58.6 % powder Take 1 packet by mouth daily.    . rosuvastatin (CRESTOR) 20 MG tablet Take 10 mg by mouth at bedtime.   3  . verapamil (CALAN-SR) 120 MG CR tablet TAKE ONE TABLET AT BEDTIME. 15 tablet 0   No facility-administered medications prior to visit.      Allergies:   Patient has no known allergies.   Social History   Socioeconomic History  . Marital status: Married    Spouse name: Not on file  . Number of children: Not on file  . Years of education: Not on file  . Highest education level: Not on file  Occupational History  . Not on file  Social Needs  . Financial resource strain: Not on file  . Food insecurity:    Worry: Not on file    Inability: Not on file  . Transportation needs:    Medical: Not on file    Non-medical: Not on file  Tobacco Use  . Smoking status: Current Some Day Smoker    Types: Cigars  . Smokeless tobacco: Never Used  Substance and Sexual Activity  . Alcohol use: Yes    Alcohol/week: 10.0 standard drinks    Types: 10 Glasses of wine per week  . Drug use: No  .  Sexual activity: Not on file  Lifestyle  . Physical activity:    Days per week: Not on file    Minutes per session: Not on file  . Stress: Not on file  Relationships  . Social connections:    Talks on phone: Not on file    Gets together: Not on file    Attends religious service: Not on file    Active member of club or organization: Not on file    Attends meetings of clubs or organizations: Not on file    Relationship status: Not on file  Other Topics Concern  . Not on file  Social History Narrative  . Not on file     Family History:  The patient's family history includes  Alcohol abuse in his brother; Atrial fibrillation in his mother; Cancer in his father; Hypertension in his brother and mother; Peripheral vascular disease in his brother; Prostate cancer in his father.   ROS:   Please see the history of present illness.    ROS all other systems are reviewed and are negative   PHYSICAL EXAM:   VS:  BP 128/74   Pulse 71   Ht 5\' 11"  (1.803 m)   Wt 194 lb (88 kg)   BMI 27.06 kg/m     General: Alert, oriented x3, no distress, appears well and fit Head: no evidence of trauma, PERRL, EOMI, no exophtalmos or lid lag, no myxedema, no xanthelasma; normal ears, nose and oropharynx Neck: normal jugular venous pulsations and no hepatojugular reflux; brisk carotid pulses without delay and no carotid bruits Chest: clear to auscultation, no signs of consolidation by percussion or palpation, normal fremitus, symmetrical and full respiratory excursions Cardiovascular: normal position and quality of the apical impulse, regular rhythm, normal first and widely split second heart sounds, no murmurs, rubs or gallops Abdomen: no tenderness or distention, no masses by palpation, no abnormal pulsatility or arterial bruits, normal bowel sounds, no hepatosplenomegaly Extremities: no clubbing, cyanosis or edema; 2+ radial, ulnar and brachial pulses bilaterally; 2+ right femoral, posterior tibial and dorsalis pedis pulses; 2+ left femoral, posterior tibial and dorsalis pedis pulses; no subclavian or femoral bruits Neurological: grossly nonfocal Psych: Normal mood and affect   Wt Readings from Last 3 Encounters:  08/18/18 194 lb (88 kg)  06/02/17 190 lb 3.2 oz (86.3 kg)  01/04/17 189 lb (85.7 kg)      Studies/Labs Reviewed:   CATH 01/04/2017  Conclusion   1. Mild nonobstructive CAD with diffuse coronary calcification 2. Normal/vigorous LV systolic function  Recommend: medical therapy for nonobstructive CAD (pt taking ASA/statin)  Note: frequent PVC's during procedure     Diagnostic Diagram          EKG:  EKG is ordered today.  It shows normal sinus rhythm with incomplete right bundle branch block (old), no repolarization changes Recent Labs: labs with Dr. Deland Pretty in March 2019: Hemoglobin 15.3, creatinine 0.9, normal liver function tests, potassium 4.2 Total cholesterol 124, TG 39, HDL 48, LDL 68  ASSESSMENT:    1. Essential hypertension   2. Coronary artery disease involving native coronary artery of native heart without angina pectoris   3. Hypercholesterolemia   4. 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 Congenital fusion of kidneys      PLAN:  In order of problems listed above:  1. HTN: Excellent control. Did not tolerate beta blockers or higher thiazide doses.  Better when he was off verapamil. 2. CAD: No significant  stenoses, but he has widespread atherosclerosis. 92nd percentile Agatston score.  3. HLP: On highly effective statin. LDL at target < 70. 4. PVCs: currently asymptomatic.  We will try to see how he feels if he stops the verapamil completely.  He will check his blood pressure readings and readings in several days.  There is a good option to take verapamil "as needed". 5. 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 6. Post viral cough: Tessalon prescribed.   Medication Adjustments/Labs and Tests Ordered: Current medicines are reviewed at length with the patient today.  Concerns regarding medicines are outlined above.  Medication changes, Labs and Tests ordered today are listed in the Patient Instructions below. Patient Instructions  Medication Instructions:  NO CHANGE If you need a refill on your cardiac medications before your next appointment, please call your pharmacy.   Lab work: If you have labs (blood work) drawn today and your tests are completely normal, you will receive your results only by: Marland Kitchen MyChart Message (if  you have MyChart) OR . A paper copy in the mail If you have any lab test that is abnormal or we need to change your treatment, we will call you to review the results.  Follow-Up: At [facility], you and your health needs are our priority.  As part of our continuing mission to provide you with exceptional heart care, we have created designated Provider Care Teams.  These Care Teams include your primary Cardiologist (physician) and Advanced Practice Providers (APPs -  Physician Assistants and Nurse Practitioners) who all work together to provide you with the care you need, when you need it. You will need a follow up appointment in 12 months.  Please call our office 2 months in advance to schedule this appointment.  You may see DR Sallyanne Kuster or one of the following Advanced Practice Providers on your designated Care Team: Almyra Deforest, [place] . Fabian Sharp, PA-C        Signed, Sanda Klein, MD  08/19/2018 9:46 AM    [facility] [place], [place], [place]  [address] Phone: [phone]; Fax: [phone]

## 2018-08-18 NOTE — Patient Instructions (Signed)
Medication Instructions:  NO CHANGE If you need a refill on your cardiac medications before your next appointment, please call your pharmacy.   Lab work: If you have labs (blood work) drawn today and your tests are completely normal, you will receive your results only by: Marland Kitchen MyChart Message (if you have MyChart) OR . A paper copy in the mail If you have any lab test that is abnormal or we need to change your treatment, we will call you to review the results.  Follow-Up: At [facility], you and your health needs are our priority.  As part of our continuing mission to provide you with exceptional heart care, we have created designated Provider Care Teams.  These Care Teams include your primary Cardiologist (physician) and Advanced Practice Providers (APPs -  Physician Assistants and Nurse Practitioners) who all work together to provide you with the care you need, when you need it. You will need a follow up appointment in 12 months.  Please call our office 2 months in advance to schedule this appointment.  You may see DR Sallyanne Kuster or one of the following Advanced Practice Providers on your designated Care Team: Almyra Deforest, [place] . Fabian Sharp, PA-C

## 2018-08-21 DIAGNOSIS — D1722 Benign lipomatous neoplasm of skin and subcutaneous tissue of left arm: Secondary | ICD-10-CM | POA: Diagnosis not present

## 2018-08-21 DIAGNOSIS — L821 Other seborrheic keratosis: Secondary | ICD-10-CM | POA: Diagnosis not present

## 2018-08-21 DIAGNOSIS — D225 Melanocytic nevi of trunk: Secondary | ICD-10-CM | POA: Diagnosis not present

## 2018-08-31 ENCOUNTER — Other Ambulatory Visit: Payer: Self-pay | Admitting: Cardiovascular Disease

## 2018-09-13 ENCOUNTER — Other Ambulatory Visit: Payer: Self-pay | Admitting: Cardiovascular Disease

## 2018-09-14 DIAGNOSIS — Z0000 Encounter for general adult medical examination without abnormal findings: Secondary | ICD-10-CM | POA: Diagnosis not present

## 2018-09-14 DIAGNOSIS — Z125 Encounter for screening for malignant neoplasm of prostate: Secondary | ICD-10-CM | POA: Diagnosis not present

## 2018-09-14 DIAGNOSIS — Z1159 Encounter for screening for other viral diseases: Secondary | ICD-10-CM | POA: Diagnosis not present

## 2018-09-17 IMAGING — CT CT ANGIO ABDOMEN
1 series · 14 of 32 positions shown, 18 images · IV contrast (isovue)
Comparison: March 07, 2013 CT of the abdomen and pelvis

CLINICAL DATA: History of congenital fusion of the kidneys. Three
years of uncontrolled hypertension. Evaluate for renal artery
stenosis.

EXAM:
CT ANGIOGRAPHY ABDOMEN
TECHNIQUE: Multidetector CT imaging of the abdomen was performed using the
standard protocol during bolus administration of intravenous
contrast. Multiplanar reconstructed images and MIPs were obtained
and reviewed to evaluate the vascular anatomy.
CONTRAST:  100 mL of Isovue 370

[Series 4: arterial 3.0 i40f 2 · axial · arterial · 0.72mm/px · z∈[-322,-121]mm · 14 of 75 slices shown, 18 images]
[im 5/75  soft-tissue]
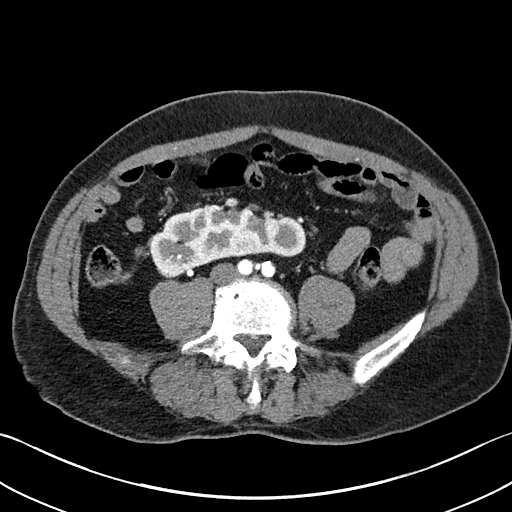
[im 5/75  bone]
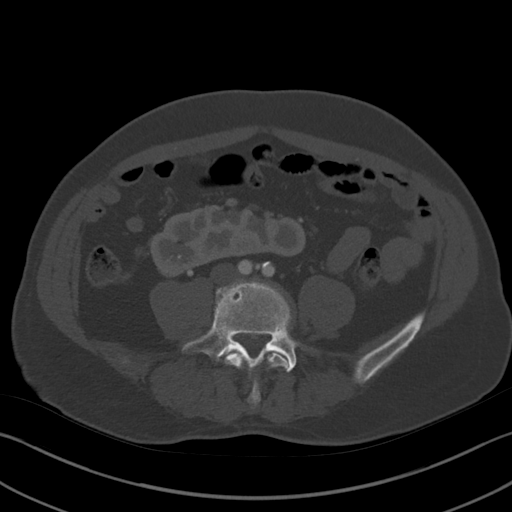
[im 10/75  soft-tissue]
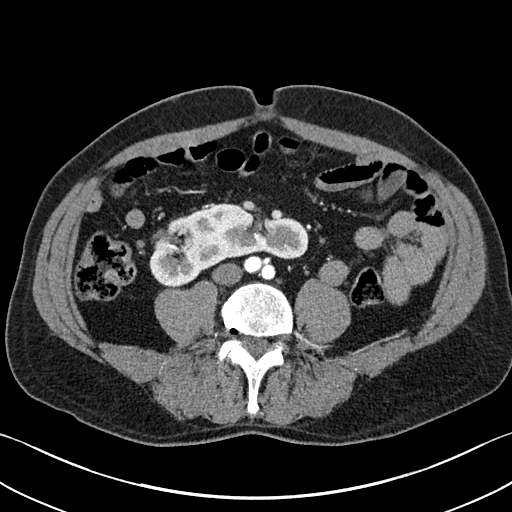
[im 17/75  soft-tissue]
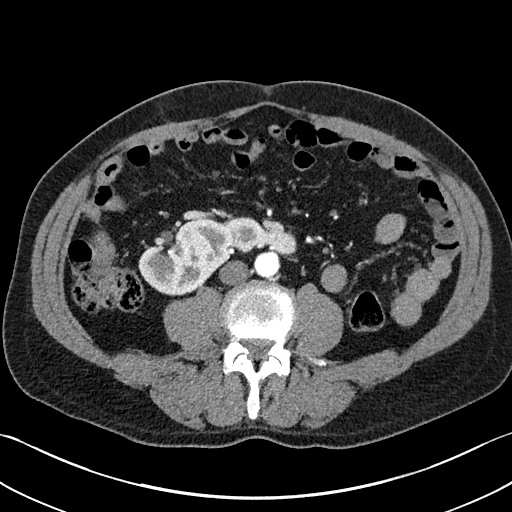
[im 22/75  soft-tissue]
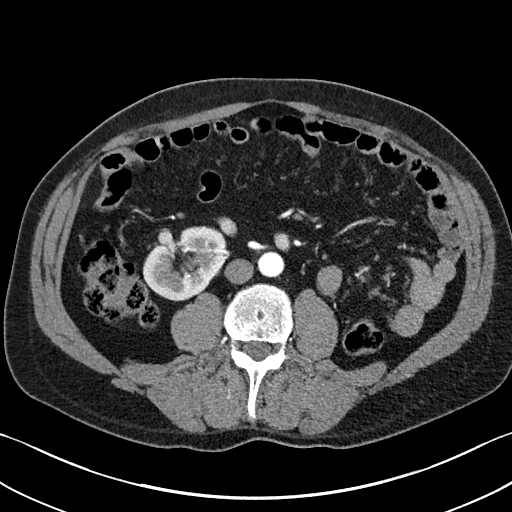
[im 29/75  soft-tissue]
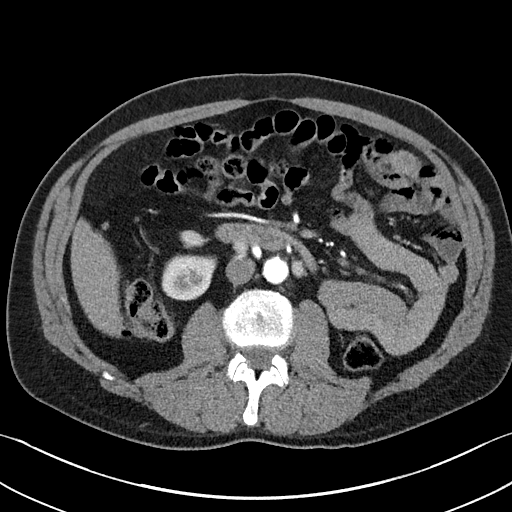
[im 34/75  soft-tissue]
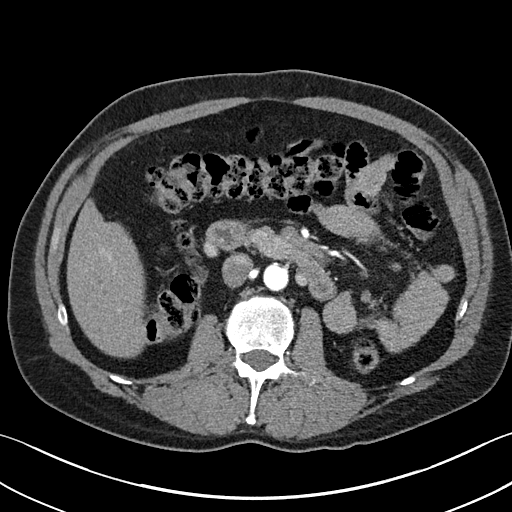
[im 41/75  soft-tissue]
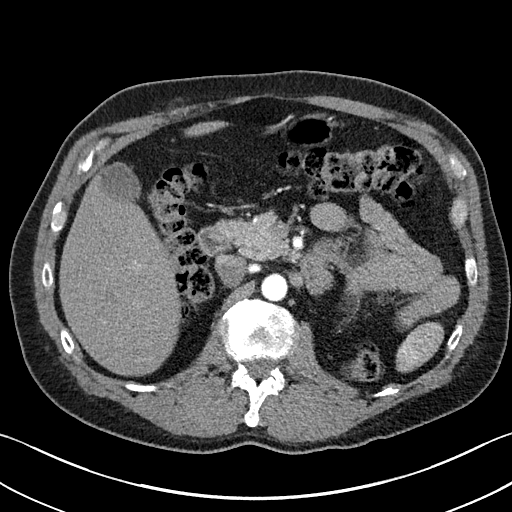
[im 46/75  soft-tissue]
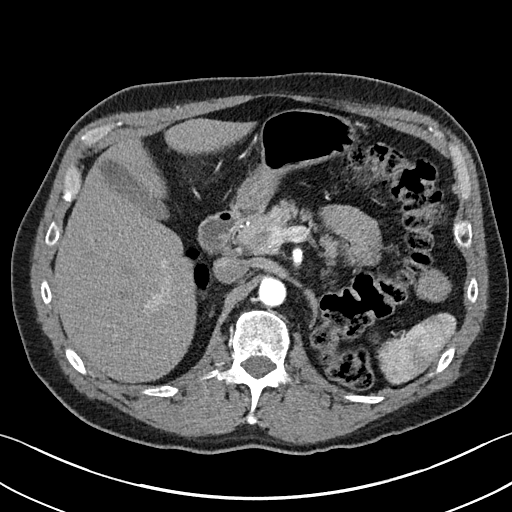
[im 53/75  soft-tissue]
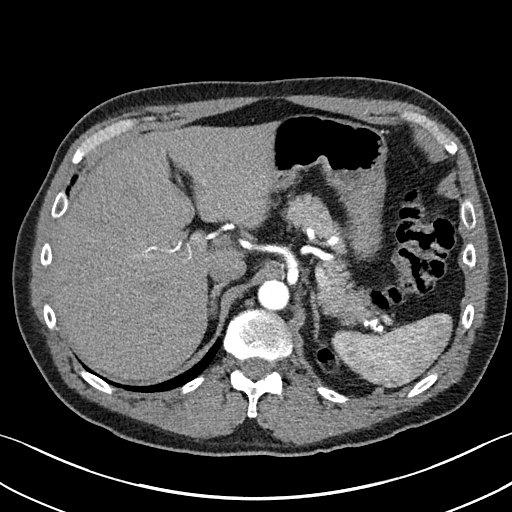
[im 53/75  bone]
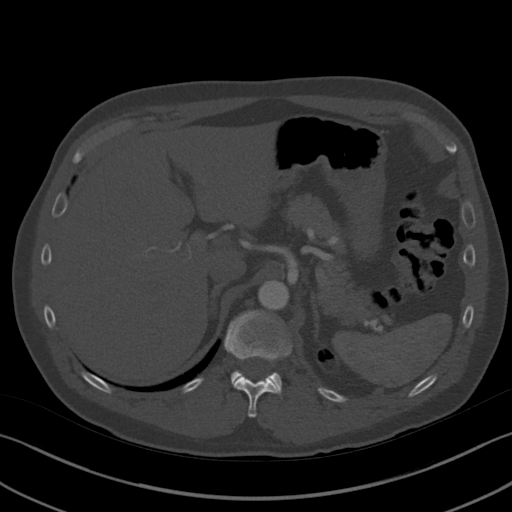
[im 58/75  soft-tissue]
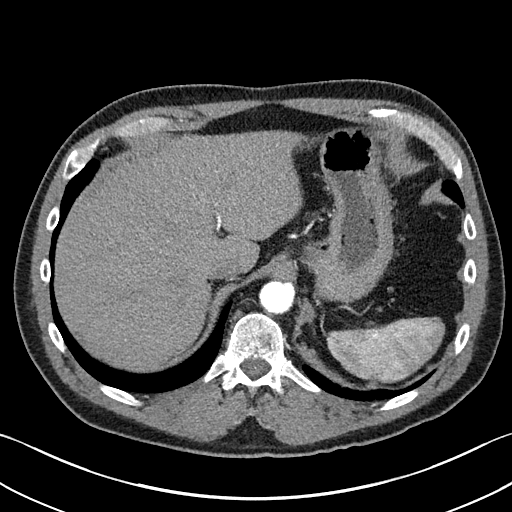
[im 65/75  soft-tissue]
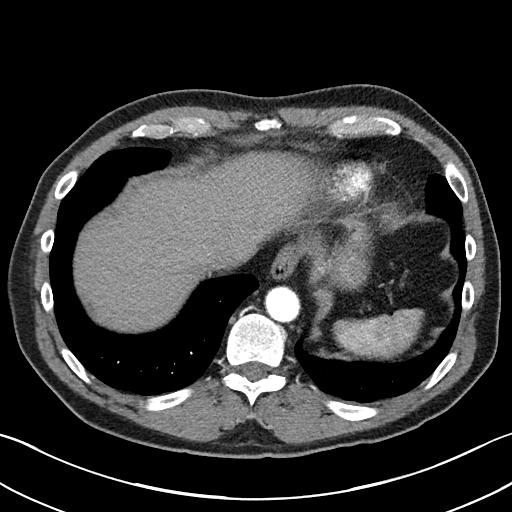
[im 65/75  lung]
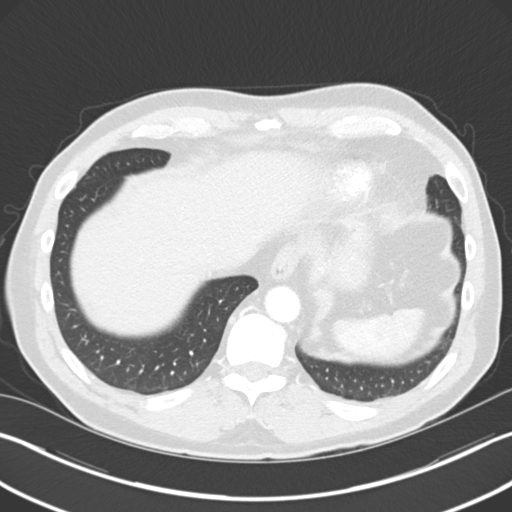
[im 67/75  lung]
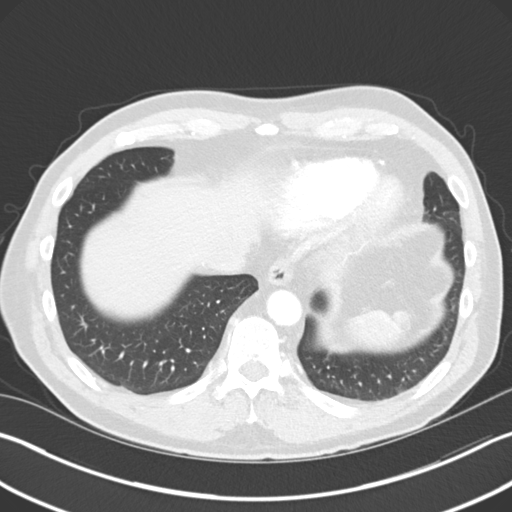
[im 70/75  soft-tissue]
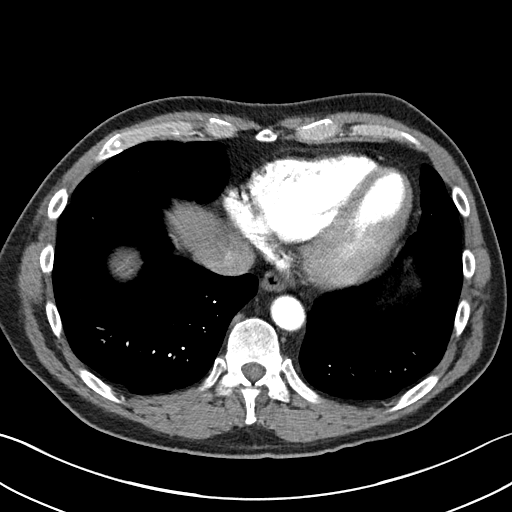
[im 70/75  lung]
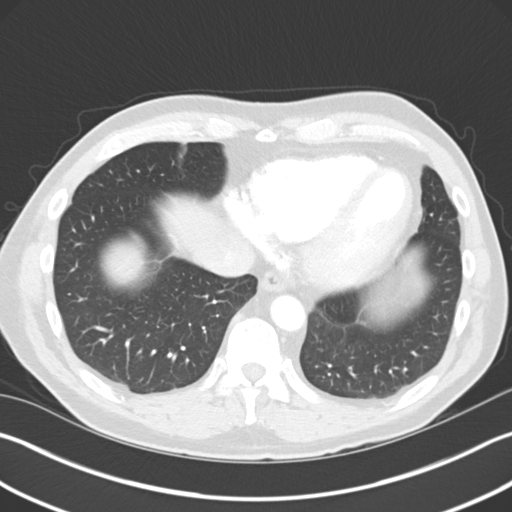
[im 72/75  lung]
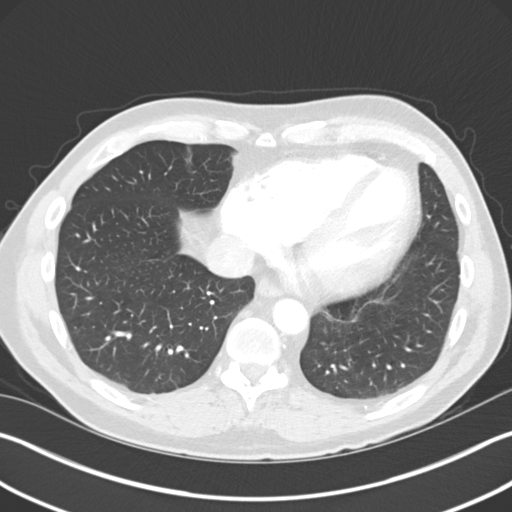

[14 of 32 positions shown; findings below may reference images not displayed]

FINDINGS: VASCULAR

Aorta: Normal caliber aorta without aneurysm, dissection, vasculitis
or significant stenosis. Minimal atherosclerosis in the aorta and
iliac vessels.

Celiac: Patent without evidence of aneurysm, dissection, vasculitis
or significant stenosis.

SMA: Patent without evidence of aneurysm, dissection, vasculitis or
significant stenosis.

Renals: There is a small caliber left renal artery which is
unchanged in the interval with no associated stenosis. There is a
dominant right renal artery arising from the aorta. This renal
artery is mildly tortuous but there is no stenosis identified. There
is also a right renal artery arising from the right common iliac
artery with no stenosis or abnormality. There is a fourth renal
artery arising from the midline of the aorta just above the
bifurcation with no stenosis.

IMA: Patent without evidence of aneurysm, dissection, vasculitis or
significant stenosis.

Inflow: Patent without evidence of aneurysm, dissection, vasculitis
or significant stenosis.

Veins: No obvious venous abnormality within the limitations of this
arterial phase study.

Review of the MIP images confirms the above findings.

NON-VASCULAR

Lower chest: No acute abnormality.

Hepatobiliary: No focal liver abnormality is seen. No gallstones,
gallbladder wall thickening, or biliary dilatation.

Pancreas: Unremarkable. No pancreatic ductal dilatation or
surrounding inflammatory changes.

Spleen: Normal in size without focal abnormality.

Adrenals/Urinary Tract: The adrenal glands are normal in appearance.
The right and left kidneys are fused into a single structure in the
lower abdomen and upper pelvis, centered to the right of midline. No
suspicious masses or obstruction identified. The bladder was not
included on today's study

Stomach/Bowel: The visualized stomach, small intestines, colon, and
appendix are normal.

Lymphatic: No significant vascular findings are present. No enlarged
abdominal or pelvic lymph nodes.

Reproductive: Not included on today's study.

Other: No abdominal wall hernia or abnormality. No abdominopelvic
ascites.

Musculoskeletal: No acute or significant osseous findings.
IMPRESSION: VASCULAR

There are 4 renal arteries as above with no stenosis or cause for
hypertension identified. No other vascular abnormalities.

NON-VASCULAR

No significant abnormalities. Fusion of the kidneys is again
identified as described above.

## 2018-10-01 ENCOUNTER — Other Ambulatory Visit: Payer: Self-pay | Admitting: Cardiovascular Disease

## 2018-10-02 NOTE — Telephone Encounter (Signed)
Verapamil 120mg  refilled.

## 2019-02-12 ENCOUNTER — Other Ambulatory Visit: Payer: Self-pay | Admitting: Cardiovascular Disease

## 2019-06-16 ENCOUNTER — Other Ambulatory Visit: Payer: Self-pay | Admitting: Cardiovascular Disease

## 2019-06-18 NOTE — Telephone Encounter (Signed)
Rx(s) sent to pharmacy electronically.  

## 2019-09-12 ENCOUNTER — Other Ambulatory Visit: Payer: Self-pay | Admitting: Cardiovascular Disease

## 2019-09-19 ENCOUNTER — Telehealth: Payer: Self-pay | Admitting: Cardiovascular Disease

## 2019-09-19 NOTE — Telephone Encounter (Signed)
Patient calling stating he was disconnected with Pam from billing. Transferred him to her.

## 2019-10-03 ENCOUNTER — Telehealth: Payer: 59 | Admitting: Cardiovascular Disease

## 2019-10-14 ENCOUNTER — Other Ambulatory Visit: Payer: Self-pay | Admitting: Cardiovascular Disease

## 2019-10-24 NOTE — Telephone Encounter (Signed)
error 

## 2019-11-12 ENCOUNTER — Other Ambulatory Visit: Payer: Self-pay | Admitting: Cardiovascular Disease

## 2019-12-18 ENCOUNTER — Other Ambulatory Visit: Payer: Self-pay | Admitting: Cardiovascular Disease

## 2019-12-26 ENCOUNTER — Encounter: Payer: Self-pay | Admitting: Cardiovascular Disease

## 2019-12-26 ENCOUNTER — Other Ambulatory Visit: Payer: Self-pay

## 2019-12-26 ENCOUNTER — Ambulatory Visit (INDEPENDENT_AMBULATORY_CARE_PROVIDER_SITE_OTHER): Payer: 59 | Admitting: Cardiovascular Disease

## 2019-12-26 VITALS — BP 138/78 | HR 67 | Ht 71.0 in | Wt 194.2 lb

## 2019-12-26 DIAGNOSIS — E7800 Pure hypercholesterolemia, unspecified: Secondary | ICD-10-CM

## 2019-12-26 DIAGNOSIS — I493 Ventricular premature depolarization: Secondary | ICD-10-CM

## 2019-12-26 DIAGNOSIS — I10 Essential (primary) hypertension: Secondary | ICD-10-CM

## 2019-12-26 DIAGNOSIS — I2510 Atherosclerotic heart disease of native coronary artery without angina pectoris: Secondary | ICD-10-CM

## 2019-12-26 DIAGNOSIS — Q631 Lobulated, fused and horseshoe kidney: Secondary | ICD-10-CM

## 2019-12-26 NOTE — Patient Instructions (Signed)
Medication Instructions:  The current medical regimen is effective;  continue present plan and medications as directed. Please refer to the Current Medication list given to you today. *If you need a refill on your cardiac medications before your next appointment, please call your pharmacy*  Follow-Up: Your next appointment:  12 month(s) Please call our office 2 months in advance to schedule this appointment In Person with You may see No primary care provider on file. or one of the following Advanced Practice Providers on your designated Care Team:    Almyra Deforest, PA-C  Fabian Sharp, [place] or   Roby Lofts, PA-C  At [facility], you and your health needs are our priority.  As part of our continuing mission to provide you with exceptional heart care, we have created designated Provider Care Teams.  These Care Teams include your primary Cardiologist (physician) and Advanced Practice Providers (APPs -  Physician Assistants and Nurse Practitioners) who all work together to provide you with the care you need, when you need it.  We recommend signing up for the patient portal called "MyChart".  Sign up information is provided on this After Visit Summary.  MyChart is used to connect with patients for Virtual Visits (Telemedicine).  Patients are able to view lab/test results, encounter notes, upcoming appointments, etc.  Non-urgent messages can be sent to your provider as well.   To learn more about what you can do with MyChart, go to [email or web address].

## 2019-12-26 NOTE — Progress Notes (Signed)
Cardiology Office Note    Date:  12/26/2019   ID:  Vasily, Fedewa 1956/03/07, MRN [id number]  PCP:  Deland Pretty, MD  Cardiologist:   Sanda Klein, MD   Chief complaint: HTN, fatigue  History of Present Illness:  Steven Hamilton is a 64 y.o. male with essential hypertension, verapamil-responsive PVCs, coronary atherosclerosis without significant stenoses, hyperlipidemia who presents for follow-up.  He feels great.  He just returned from a 2-week trip out [place] where he hiked and rode horses without any complaints of dyspnea or chest discomfort.  He has not been troubled by palpitations.  He still bemoans the fact that when the weather is hot he gets tired quicker than he did in the past but he has excellent exercise capacity overall.  Denies edema, orthopnea, PND, claudication, focal neurological complaints.  He is compliant with his statin and had labs performed with Dr. Shelia Media just last week (results not yet available).  His blood pressure at home is consistently in the 120s/70s.  He had verapamil responsive PVCs and did not tolerate treatment with a beta blocker due to fatigue.    He had a normal treadmill stress test in 2012 (with hypertensive response to exercise). In 2014, CT showed "Kidneys demonstrate congenital crossed fused ectopia".  Cardiac catheterization in July 2018 showed mild nonobstructive CAD with diffuse coronary calcification and normal left ventricular systolic function.  He has a high calcium plaque burden.  He has multiple lipomas which are becoming larger and particularly bothersome over his abdomen.  He is considering surgery and will meet with a general surgeon next month.  His labs are followed with Dr. Deland Pretty.    Past Medical History:  Diagnosis Date  . Hypertension     Past Surgical History:  Procedure Laterality Date  . HERNIA REPAIR    . LEFT HEART CATH AND CORONARY ANGIOGRAPHY N/A 01/04/2017   Procedure: Left Heart Cath and Coronary  Angiography;  Surgeon: [name], MD;  Location: [facility];  Service: Cardiovascular;  Laterality: N/A;    Current Medications: Outpatient Medications Prior to Visit  Medication Sig Dispense Refill  . aspirin EC 81 MG tablet Take 81 mg by mouth daily.    Marland Kitchen lisinopril-hydrochlorothiazide (ZESTORETIC) 20-12.5 MG tablet Take 1 tablet by mouth daily. KEEP OV. 30 tablet 0  . psyllium (METAMUCIL) 58.6 % powder Take 1 packet by mouth daily.    . rosuvastatin (CRESTOR) 20 MG tablet Take 10 mg by mouth at bedtime.   3  . verapamil (CALAN-SR) 120 MG CR tablet Take 1 tablet (120 mg total) by mouth at bedtime. KEEP OV. 30 tablet 0  . benzonatate (TESSALON PERLES) 100 MG capsule Take 1 capsule (100 mg total) by mouth 3 (three) times daily as needed for cough. 30 capsule 0   No facility-administered medications prior to visit.     Allergies:   Patient has no known allergies.   Social History   Socioeconomic History  . Marital status: Married    Spouse name: Not on file  . Number of children: Not on file  . Years of education: Not on file  . Highest education level: Not on file  Occupational History  . Not on file  Tobacco Use  . Smoking status: Current Some Day Smoker    Types: Cigars  . Smokeless tobacco: Never Used  Substance and Sexual Activity  . Alcohol use: Yes    Alcohol/week: 10.0 standard drinks    Types: 10 Glasses  of wine per week  . Drug use: No  . Sexual activity: Not on file  Other Topics Concern  . Not on file  Social History Narrative  . Not on file   Social Determinants of Health   Financial Resource Strain:   . Difficulty of Paying Living Expenses:   Food Insecurity:   . Worried About Charity fundraiser in the Last Year:   . Arboriculturist in the Last Year:   Transportation Needs:   . Film/video editor (Medical):   Marland Kitchen Lack of Transportation (Non-Medical):   Physical Activity:   . Days of Exercise per Week:   . Minutes of Exercise per  Session:   Stress:   . Feeling of Stress :   Social Connections:   . Frequency of Communication with Friends and Family:   . Frequency of Social Gatherings with Friends and Family:   . Attends Religious Services:   . Active Member of Clubs or Organizations:   . Attends Archivist Meetings:   Marland Kitchen Marital Status:      Family History:  The patient's family history includes Alcohol abuse in his brother; Atrial fibrillation in his mother; Cancer in his father; Hypertension in his brother and mother; Peripheral vascular disease in his brother; Prostate cancer in his father.   ROS:   Please see the history of present illness.    ROS  All other systems are reviewed and are negative. You have been on hold for the next 5-40 PHYSICAL EXAM:   VS:  BP 138/78   Pulse 67   Ht 5\' 11"  (1.803 m)   Wt 194 lb 3.2 oz (88.1 kg)   SpO2 98%   BMI 27.09 kg/m      General: Alert, oriented x3, no distress, he has multiple lipomas over his abdomen and forearms Head: no evidence of trauma, PERRL, EOMI, no exophtalmos or lid lag, no myxedema, no xanthelasma; normal ears, nose and oropharynx Neck: normal jugular venous pulsations and no hepatojugular reflux; brisk carotid pulses without delay and no carotid bruits Chest: clear to auscultation, no signs of consolidation by percussion or palpation, normal fremitus, symmetrical and full respiratory excursions Cardiovascular: normal position and quality of the apical impulse, regular rhythm, normal first and widely split second heart sounds, no murmurs, rubs or gallops Abdomen: no tenderness or distention, no masses by palpation, no abnormal pulsatility or arterial bruits, normal bowel sounds, no hepatosplenomegaly Extremities: no clubbing, cyanosis or edema; 2+ radial, ulnar and brachial pulses bilaterally; 2+ right femoral, posterior tibial and dorsalis pedis pulses; 2+ left femoral, posterior tibial and dorsalis pedis pulses; no subclavian or femoral  bruits Neurological: grossly nonfocal Psych: Normal mood and affect   Wt Readings from Last 3 Encounters:  12/26/19 194 lb 3.2 oz (88.1 kg)  08/18/18 194 lb (88 kg)  06/02/17 190 lb 3.2 oz (86.3 kg)      Studies/Labs Reviewed:   CATH 01/04/2017  Conclusion   1. Mild nonobstructive CAD with diffuse coronary calcification 2. Normal/vigorous LV systolic function  Recommend: medical therapy for nonobstructive CAD (pt taking ASA/statin)  Note: frequent PVC's during procedure   Diagnostic Diagram          EKG:  EKG is not ordered today.  2020 tracing shows normal sinus rhythm with incomplete right bundle branch block (old), no repolarization changes Recent Labs: labs with Dr. Deland Pretty received after he left the office: Chol 134, HDL 47, TG 56, LDL 76 K 4.2, creat  1.1, normal LFTs, Hgb 15.3 ASSESSMENT:    1. Essential hypertension   2. Coronary artery disease involving native coronary artery of native heart without angina pectoris   3. Hypercholesterolemia   4. 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 Congenital fusion of kidneys     PLAN:  In order of problems listed above:  1. HTN: Good control. Did not tolerate beta blockers or higher thiazide doses.   2. CAD: No significant stenoses, but he has widespread atherosclerosis. 92nd percentile Agatston score. Asymptomatic and good functional ability. Focus on risk factor modification. 3. HLP: On highly effective statin. LDL not quite at target < 70. Focus more on diet, last year LDL was 64 on same meds. 4. PVCs: currently asymptomatic, on verapamil. 5. 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 Normal renal function. 6. Lipomas: I am not sure that he really needs surgery, but he is at low risk for CV complications if he decides to proceed.    Medication Adjustments/Labs and Tests Ordered: Current medicines are  reviewed at length with the patient today.  Concerns regarding medicines are outlined above.  Medication changes, Labs and Tests ordered today are listed in the Patient Instructions below. Patient Instructions  Medication Instructions:  The current medical regimen is effective;  continue present plan and medications as directed. Please refer to the Current Medication list given to you today. *If you need a refill on your cardiac medications before your next appointment, please call your pharmacy*  Follow-Up: Your next appointment:  12 month(s) Please call our office 2 months in advance to schedule this appointment In Person with You may see No primary care provider on file. or one of the following Advanced Practice Providers on your designated Care Team:    Almyra Deforest, PA-C  Fabian Sharp, [place] or   Roby Lofts, PA-C  At [facility], you and your health needs are our priority.  As part of our continuing mission to provide you with exceptional heart care, we have created designated Provider Care Teams.  These Care Teams include your primary Cardiologist (physician) and Advanced Practice Providers (APPs -  Physician Assistants and Nurse Practitioners) who all work together to provide you with the care you need, when you need it.  We recommend signing up for the patient portal called "MyChart".  Sign up information is provided on this After Visit Summary.  MyChart is used to connect with patients for Virtual Visits (Telemedicine).  Patients are able to view lab/test results, encounter notes, upcoming appointments, etc.  Non-urgent messages can be sent to your provider as well.   To learn more about what you can do with MyChart, go to [email or web address].          Signed, Sanda Klein, MD  12/26/2019 10:26 AM    [facility] [place] [address], [place], [place]  [address] Phone: [phone]; Fax: [phone]

## 2020-01-18 ENCOUNTER — Other Ambulatory Visit: Payer: Self-pay | Admitting: Cardiovascular Disease

## 2020-05-26 ENCOUNTER — Other Ambulatory Visit: Payer: Self-pay

## 2020-05-26 ENCOUNTER — Ambulatory Visit (INDEPENDENT_AMBULATORY_CARE_PROVIDER_SITE_OTHER): Payer: 59 | Admitting: Otolaryngology

## 2020-05-26 ENCOUNTER — Encounter (INDEPENDENT_AMBULATORY_CARE_PROVIDER_SITE_OTHER): Payer: Self-pay | Admitting: Otolaryngology

## 2020-05-26 VITALS — Temp 96.3°F

## 2020-05-26 DIAGNOSIS — H6983 Other specified disorders of Eustachian tube, bilateral: Secondary | ICD-10-CM

## 2020-05-26 NOTE — Progress Notes (Signed)
HPI: Steven Hamilton is a 64 y.o. male who presents for evaluation of sensation of water and decreased hearing in both ears which is generally worse on the right side. He feels like he hears better when he "clears his ears". Denies any pain. 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 He presently uses ear protection. He does have some allergies that are bad in the spring and fall and uses Claritin sometimes.  Past Medical History:  Diagnosis Date  . Hypertension    Past Surgical History:  Procedure Laterality Date  . HERNIA REPAIR    . LEFT HEART CATH AND CORONARY ANGIOGRAPHY N/A 01/04/2017   Procedure: Left Heart Cath and Coronary Angiography;  Surgeon: [name], MD;  Location: [facility];  Service: Cardiovascular;  Laterality: N/A;   Social History   Socioeconomic History  . Marital status: Married    Spouse name: Not on file  . Number of children: Not on file  . Years of education: Not on file  . Highest education level: Not on file  Occupational History  . Not on file  Tobacco Use  . Smoking status: Current Some Day Smoker    Types: Cigars  . Smokeless tobacco: Never Used  Substance and Sexual Activity  . Alcohol use: Yes    Alcohol/week: 10.0 standard drinks    Types: 10 Glasses of wine per week  . Drug use: No  . Sexual activity: Not on file  Other Topics Concern  . Not on file  Social History Narrative  . Not on file   Social Determinants of Health   Financial Resource Strain:   . Difficulty of Paying Living Expenses: Not on file  Food Insecurity:   . Worried About Charity fundraiser in the Last Year: Not on file  . Ran Out of Food in the Last Year: Not on file  Transportation Needs:   . Lack of Transportation (Medical): Not on file  . Lack of Transportation (Non-Medical): Not on file  Physical Activity:   . Days of Exercise per Week: Not on file  .  Minutes of Exercise per Session: Not on file  Stress:   . Feeling of Stress : Not on file  Social Connections:   . Frequency of Communication with Friends and Family: Not on file  . Frequency of Social Gatherings with Friends and Family: Not on file  . Attends Religious Services: Not on file  . Active Member of Clubs or Organizations: Not on file  . Attends Archivist Meetings: Not on file  . Marital Status: Not on file   Family History  Problem Relation Age of Onset  . Cancer Father        pancreatic  . Prostate cancer Father   . Peripheral vascular disease Brother   . Alcohol abuse Brother   . Hypertension Brother   . Hypertension Mother   . Atrial fibrillation Mother    No Known Allergies Prior to Admission medications   Medication Sig Start Date End Date Taking? Authorizing Provider  aspirin EC 81 MG tablet Take 81 mg by mouth daily.   Yes [provider]  lisinopril-hydrochlorothiazide (ZESTORETIC) 20-12.5 MG tablet TAKE 1 TABLET ONCE DAILY. 01/18/20  Yes Croitoru, Mihai, MD  psyllium (METAMUCIL) 58.6 % powder Take 1 packet by mouth daily.   Yes [provider]  rosuvastatin (CRESTOR) 20 MG tablet Take 10  mg by mouth at bedtime.  12/17/16  Yes [provider]  verapamil (CALAN-SR) 120 MG CR tablet TAKE ONE TABLET AT BEDTIME. 01/18/20  Yes Croitoru, Mihai, MD     Positive ROS: Otherwise negative  All other systems have been reviewed and were otherwise negative with the exception of those mentioned in the HPI and as above.  Physical Exam: Constitutional: Alert, well-appearing, no acute distress Ears: External ears without lesions or tenderness. Ear canals are clear bilaterally with no significant wax buildup. TMs are clear bilaterally with good mobility on pneumatic otoscopy. Hearing screening with the tuning forks revealed symmetric hearing with AC > BC bilaterally. Subjectively with a 1024 tuning fork he heard well in both ears. Nasal:  External nose without lesions. Septum with moderate deviation and mild rhinitis. Both middle meatus regions were clear with no signs of infection.. Clear nasal passages otherwise. Oral: Lips and gums without lesions. Tongue and palate mucosa without lesions. Posterior oropharynx clear. Neck: No palpable adenopathy or masses Respiratory: Breathing comfortably  Skin: No facial/neck lesions or rash noted.  Procedures  Assessment: Rhinitis with mild eustachian tube dysfunction. Probably some underlying sensorineural hearing loss as he does complain of some trouble hearing when his ears are congested. But TMs are clear bilaterally today.  Plan: Prescribed Nasacort 2 sprays each nostril at night. Suggest that he might obtain audiogram to evaluate any hearing loss.  Radene Journey, MD

## 2020-11-12 ENCOUNTER — Other Ambulatory Visit: Payer: Self-pay | Admitting: Cardiovascular Disease

## 2020-11-12 ENCOUNTER — Encounter: Payer: Self-pay | Admitting: Cardiovascular Disease

## 2020-11-12 MED ORDER — LISINOPRIL 10 MG PO TABS
20.0000 mg | ORAL_TABLET | Freq: Every day | ORAL | 3 refills | Status: DC
Start: 2020-11-12 — End: 2021-05-14

## 2020-11-12 NOTE — Progress Notes (Signed)
Steven Hamilton called with complaints of extreme fatigue and low blood pressure 90/60 after working outside.  This happens every time summer comes around.  When his blood pressure gets really low (as low as the 70s) he feels nauseous.  He has not had any chest discomfort.  His typical blood pressure when not working in the heat is in the 120s/70s. Disontinue lisinopril-hydrochlorothiazide and replace with lisinopril 20 mg once daily alone.  Will advise him that it will take a couple of weeks for the change in medication to have full impact.

## 2020-12-25 DIAGNOSIS — Z0000 Encounter for general adult medical examination without abnormal findings: Secondary | ICD-10-CM | POA: Diagnosis not present

## 2020-12-25 DIAGNOSIS — Z125 Encounter for screening for malignant neoplasm of prostate: Secondary | ICD-10-CM | POA: Diagnosis not present

## 2021-01-05 DIAGNOSIS — J309 Allergic rhinitis, unspecified: Secondary | ICD-10-CM | POA: Diagnosis not present

## 2021-01-05 DIAGNOSIS — D179 Benign lipomatous neoplasm, unspecified: Secondary | ICD-10-CM | POA: Diagnosis not present

## 2021-01-05 DIAGNOSIS — Z0001 Encounter for general adult medical examination with abnormal findings: Secondary | ICD-10-CM | POA: Diagnosis not present

## 2021-01-05 DIAGNOSIS — M2550 Pain in unspecified joint: Secondary | ICD-10-CM | POA: Diagnosis not present

## 2021-01-05 DIAGNOSIS — Z7982 Long term (current) use of aspirin: Secondary | ICD-10-CM | POA: Diagnosis not present

## 2021-01-05 DIAGNOSIS — I2510 Atherosclerotic heart disease of native coronary artery without angina pectoris: Secondary | ICD-10-CM | POA: Diagnosis not present

## 2021-02-13 ENCOUNTER — Other Ambulatory Visit: Payer: Self-pay | Admitting: Cardiovascular Disease

## 2021-02-18 DIAGNOSIS — L821 Other seborrheic keratosis: Secondary | ICD-10-CM | POA: Diagnosis not present

## 2021-02-18 DIAGNOSIS — L720 Epidermal cyst: Secondary | ICD-10-CM | POA: Diagnosis not present

## 2021-05-14 ENCOUNTER — Other Ambulatory Visit: Payer: Self-pay | Admitting: Cardiovascular Disease

## 2021-05-14 MED ORDER — ROSUVASTATIN CALCIUM 10 MG PO TABS: 10.0000 mg | ORAL_TABLET | Freq: Every day | ORAL | 3 refills | Status: DC

## 2021-07-01 DIAGNOSIS — J4521 Mild intermittent asthma with (acute) exacerbation: Secondary | ICD-10-CM | POA: Diagnosis not present

## 2021-07-01 DIAGNOSIS — L309 Dermatitis, unspecified: Secondary | ICD-10-CM | POA: Diagnosis not present

## 2021-07-20 ENCOUNTER — Other Ambulatory Visit: Payer: Self-pay | Admitting: *Deleted

## 2021-07-20 ENCOUNTER — Encounter: Payer: Self-pay | Admitting: Cardiovascular Disease

## 2021-07-20 DIAGNOSIS — R5383 Other fatigue: Secondary | ICD-10-CM | POA: Diagnosis not present

## 2021-07-20 DIAGNOSIS — R053 Chronic cough: Secondary | ICD-10-CM | POA: Diagnosis not present

## 2021-07-20 DIAGNOSIS — R0602 Shortness of breath: Secondary | ICD-10-CM | POA: Diagnosis not present

## 2021-07-20 DIAGNOSIS — I4891 Unspecified atrial fibrillation: Secondary | ICD-10-CM

## 2021-07-20 DIAGNOSIS — R002 Palpitations: Secondary | ICD-10-CM | POA: Diagnosis not present

## 2021-07-20 NOTE — Progress Notes (Signed)
Steven Hamilton called to tell me that he felt poorly last night, very restless and difficult sleep, urinating frequently, then noticed that he had reduced exercise tolerance today.  He went to his primary care physician's office and was told that his electrocardiogram shows atrial fibrillation.  He was prescribed an anticoagulant which he is currently picking up from the pharmacy.  He does not know whether his heart rate was fast, but he was not prescribed any other medications.  He is already on verapamil as an antihypertensive and this may be providing rate control.  He will send me an image of his electrocardiogram when he gets home. He does not have severe symptoms and is not dizzy, denies syncope, focal neurological complaints, recent bleeding problems or serious injuries, orthopnea, PND, chest pain at rest or with activity. I encouraged him to go ahead and fill the prescription for the anticoagulant and look forward to seeing the copy of his ECG.  We will try to make arrangements to meet Steven Hamilton in the atrial fibrillation clinic at [facility] this week.

## 2021-07-22 ENCOUNTER — Ambulatory Visit (HOSPITAL_COMMUNITY): Payer: 59 | Admitting: Nurse Practitioner

## 2021-07-28 ENCOUNTER — Other Ambulatory Visit: Payer: Self-pay

## 2021-07-28 ENCOUNTER — Ambulatory Visit (HOSPITAL_COMMUNITY): Payer: BC Managed Care – PPO | Attending: Cardiovascular Disease

## 2021-07-28 DIAGNOSIS — I4891 Unspecified atrial fibrillation: Secondary | ICD-10-CM | POA: Insufficient documentation

## 2021-07-29 ENCOUNTER — Ambulatory Visit (INDEPENDENT_AMBULATORY_CARE_PROVIDER_SITE_OTHER): Payer: BC Managed Care – PPO | Admitting: Cardiovascular Disease

## 2021-07-29 ENCOUNTER — Encounter: Payer: Self-pay | Admitting: Cardiovascular Disease

## 2021-07-29 VITALS — BP 162/84 | HR 61 | Ht 71.0 in | Wt 187.0 lb

## 2021-07-29 DIAGNOSIS — I480 Paroxysmal atrial fibrillation: Secondary | ICD-10-CM | POA: Diagnosis not present

## 2021-07-29 DIAGNOSIS — I2510 Atherosclerotic heart disease of native coronary artery without angina pectoris: Secondary | ICD-10-CM | POA: Diagnosis not present

## 2021-07-29 DIAGNOSIS — I10 Essential (primary) hypertension: Secondary | ICD-10-CM

## 2021-07-29 DIAGNOSIS — E7800 Pure hypercholesterolemia, unspecified: Secondary | ICD-10-CM | POA: Diagnosis not present

## 2021-07-29 DIAGNOSIS — I493 Ventricular premature depolarization: Secondary | ICD-10-CM

## 2021-07-29 LAB — ECHOCARDIOGRAM COMPLETE
Area-P 1/2: 4.68 cm2
S' Lateral: 2.85 cm

## 2021-07-29 NOTE — Patient Instructions (Signed)
Medication Instructions:  RESTART the Losartan 50 mg once daily  *If you need a refill on your cardiac medications before your next appointment, please call your pharmacy*   Lab Work: None ordered If you have labs (blood work) drawn today and your tests are completely normal, you will receive your results only by: [place] (if you have MyChart) OR A paper copy in the mail If you have any lab test that is abnormal or we need to change your treatment, we will call you to review the results.   Testing/Procedures: None ordered   Follow-Up: At [facility], you and your health needs are our priority.  As part of our continuing mission to provide you with exceptional heart care, we have created designated Provider Care Teams.  These Care Teams include your primary Cardiologist (physician) and Advanced Practice Providers (APPs -  Physician Assistants and Nurse Practitioners) who all work together to provide you with the care you need, when you need it.  We recommend signing up for the patient portal called "MyChart".  Sign up information is provided on this After Visit Summary.  MyChart is used to connect with patients for Virtual Visits (Telemedicine).  Patients are able to view lab/test results, encounter notes, upcoming appointments, etc.  Non-urgent messages can be sent to your provider as well.   To learn more about what you can do with MyChart, go to [email or web address].    Your next appointment:   12 month(s)  The format for your next appointment:   In Person  Provider:   Sanda Klein, MD {  I

## 2021-07-31 ENCOUNTER — Encounter: Payer: Self-pay | Admitting: Cardiovascular Disease

## 2021-07-31 DIAGNOSIS — I480 Paroxysmal atrial fibrillation: Secondary | ICD-10-CM | POA: Insufficient documentation

## 2021-07-31 NOTE — Progress Notes (Signed)
Cardiology Office Note:    Date:  07/31/2021   ID:  Steven, Hamilton 1955-12-22, MRN [id number]  PCP:  Deland Pretty, MD   [facility] Providers Cardiologist:  Sanda Klein, MD     Referring MD: Deland Pretty, MD   Chief Complaint  Patient presents with   Atrial Fibrillation    History of Present Illness:    Steven Hamilton is a 66 y.o. male with a hx of hypertension, hyperlipidemia and nonobstructive coronary atherosclerosis, right ventricular outflow tract PVCs and a congenital kidney abnormality (crossed fusion ectopia) with normal kidney function, presents after recent episode of paroxysmal atrial fibrillation.  He was not really aware of palpitations, but had a very restless night, frequent urination and poor exercise tolerance the next day.  He went to his primary care provider's office and his ECG showed atrial fibrillation with rapid ventricular response.  I recommended doubling his dose of verapamil and stopping losartan to avoid hypotension, also started him on Eliquis.  Within roughly 24 hours he converted back to normal rhythm, which he confirmed with his smart watch.  He therefore remained on the lower dose of verapamil, but stopped the losartan nonetheless.  His blood pressure is now elevated and has a little bit of a headache.  His exercise tolerance has returned to normal.  He never had problems with chest pain or shortness of breath during these events.  Episode of atrial fibrillation appeared to occur on the tail end of a protracted viral illness with nonproductive cough, which has still not resolved.  For this reason his lisinopril was stopped and replaced with losartan, but the cough persists.  He has not had any problems with the Eliquis so far.  He stopped aspirin when he started the Eliquis.  He has no history of stroke, TIA or other embolic events.  He has never had sustained arrhythmia before, but he did have previous frequent monomorphic PVCs with a pattern  suggesting origin from the right ventricular outflow tract.  His mother has a history of atrial fibrillation.  Cardiac catheterization in 2018 showed mild nonobstructive atherosclerosis with diffuse calcification.  An echocardiogram in 2017 showed normal left ventricular ejection fraction, mild LVH left atrium at the upper limit of normal in size (38 mm, volume index 26.5 mL/m sq) and mild mitral insufficiency and mild aortic insufficiency.  Treadmill stress testing in 2017 showed excellent exercise capacity with hypertensive response to exercise and increased frequency of PVCs likely of outflow tract origin.   He is intolerant of beta-blockers and verapamil has been used for both blood pressure management and to suppress the PVCs, with some success.  Past Medical History:  Diagnosis Date   Hypertension     Past Surgical History:  Procedure Laterality Date   HERNIA REPAIR     LEFT HEART CATH AND CORONARY ANGIOGRAPHY N/A 01/04/2017   Procedure: Left Heart Cath and Coronary Angiography;  Surgeon: [name], MD;  Location: [facility];  Service: Cardiovascular;  Laterality: N/A;    Current Medications: Current Meds  Medication Sig   ELIQUIS 5 MG TABS tablet Take 5 mg by mouth 2 (two) times daily.   losartan (COZAAR) 50 MG tablet Take 50 mg by mouth daily.   rosuvastatin (CRESTOR) 20 MG tablet Take 1 tablet by mouth daily.   verapamil (CALAN-SR) 120 MG CR tablet TAKE ONE TABLET AT BEDTIME.   [DISCONTINUED] lisinopril (ZESTRIL) 20 MG tablet Take 1 tablet (20 mg total) by mouth daily.  Allergies:   Patient has no known allergies.   Social History   Socioeconomic History   Marital status: Married    Spouse name: Not on file   Number of children: Not on file   Years of education: Not on file   Highest education level: Not on file  Occupational History   Not on file  Tobacco Use   Smoking status: Some Days    Types: Cigars   Smokeless tobacco: Never  Substance and  Sexual Activity   Alcohol use: Yes    Alcohol/week: 10.0 standard drinks    Types: 10 Glasses of wine per week   Drug use: No   Sexual activity: Not on file  Other Topics Concern   Not on file  Social History Narrative   Not on file   Social Determinants of Health   Financial Resource Strain: Not on file  Food Insecurity: Not on file  Transportation Needs: Not on file  Physical Activity: Not on file  Stress: Not on file  Social Connections: Not on file     Family History: The patient's family history includes Alcohol abuse in his brother; Atrial fibrillation in his mother; Cancer in his father; Hypertension in his brother and mother; Peripheral vascular disease in his brother; Prostate cancer in his father.  ROS:   Please see the history of present illness.     All other systems reviewed and are negative.  EKGs/Labs/Other Studies Reviewed:    The following studies were reviewed today: Echocardiogram 02/12/2016   - Left ventricle: The cavity size was normal. Wall thickness was   increased in a pattern of mild LVH. Systolic function was normal.   The estimated ejection fraction was in the range of 55% to 60%.   Wall motion was normal; there were no regional wall motion   abnormalities. Left ventricular diastolic function parameters   were normal.  - Aortic valve: There was mild regurgitation.  - Mitral valve: There was mild regurgitation.   IVS thickness, ED                        11.7  mm      LA ID, A-P, ES                           37.76 mm  Cardiac catheterization 01/04/2017  Dominance: Right  EKG:  EKG is ordered today.  The ekg ordered today demonstrates normal sinus rhythm, normal tracing.  QTc 426 ms  Recent Labs: No results found for requested labs within last 8760 hours.  Recent Lipid Panel    Component Value Date/Time   CHOL 184 04/20/2012 0903   TRIG 37.0 04/20/2012 0903   HDL 44.40 04/20/2012 0903   CHOLHDL 4 04/20/2012 0903   VLDL 7.4 04/20/2012  0903   LDLCALC 132 (H) 04/20/2012 0903     Risk Assessment/Calculations:    CHA2DS2-VASc Score = 2   This indicates a 2.2% annual risk of stroke. The patient's score is based upon: CHF History: 0 HTN History: 1 Diabetes History: 0 Stroke History: 0 Vascular Disease History: 1 Age Score: 0 Gender Score: 0          Physical Exam:    VS:  BP (!) 162/84 (BP Location: Left Arm, Patient Position: Sitting, Cuff Size: Large)    Pulse 61    Ht 5\' 11"  (1.803 m)    Wt 187 lb (84.8 kg)  SpO2 97%    BMI 26.08 kg/m     Wt Readings from Last 3 Encounters:  07/29/21 187 lb (84.8 kg)  12/26/19 194 lb 3.2 oz (88.1 kg)  08/18/18 194 lb (88 kg)     GEN: Appears well, well nourished, well developed in no acute distress HEENT: Normal NECK: No JVD; No carotid bruits LYMPHATICS: No lymphadenopathy CARDIAC: RRR, no murmurs, rubs, gallops RESPIRATORY:  Clear to auscultation without rales, wheezing or rhonchi  ABDOMEN: Soft, non-tender, non-distended MUSCULOSKELETAL:  No edema; No deformity  SKIN: Warm and dry NEUROLOGIC:  Alert and oriented x 3 PSYCHIATRIC:  Normal affect   ASSESSMENT:    1. Paroxysmal atrial fibrillation (HCC)   2. Essential hypertension   3. Coronary artery disease involving native coronary artery of native heart without angina pectoris   4. Hypercholesterolemia   5. Right ventricular outflow tract premature ventricular contractions (PVCs)    PLAN:    In order of problems listed above:  Afib: He has had only one symptomatic event, spontaneously terminated.  He did have rapid ventricular response.  If he has recurrent episodes of A. fib with RVR he can double his dose of verapamil and temporarily hold his losartan.  CHA2DS2-VASc score suggest he needs anticoagulation.  He will be 66 years old in a few more months.  He has hypertension.  Although he does not have flow-limiting coronary stenoses he does have evidence of extensive atherosclerotic calcifications.  Brief  interruptions in the Eliquis however should not be a problem.  Stop aspirin.  Previous echo showed borderline left atrial enlargement, we should reassess. HTN: Resume losartan.  He should only hold this medication when he doubles the verapamil. CAD: Extensive coronary calcifications, but no stenotic lesions.  The focus is on risk factor modification. HLP: Continue rosuvastatin.  Need to get his most recent labs from PCP, but would target an LDL cholesterol less than 70 if possible. RVOT PVCs: Seems to have had a good response to verapamil.  There are no longer noticed.           Medication Adjustments/Labs and Tests Ordered: Current medicines are reviewed at length with the patient today.  Concerns regarding medicines are outlined above.  Orders Placed This Encounter  Procedures   EKG 12-Lead   No orders of the defined types were placed in this encounter.   Patient Instructions  Medication Instructions:  RESTART the Losartan 50 mg once daily  *If you need a refill on your cardiac medications before your next appointment, please call your pharmacy*   Lab Work: None ordered If you have labs (blood work) drawn today and your tests are completely normal, you will receive your results only by: [place] (if you have MyChart) OR A paper copy in the mail If you have any lab test that is abnormal or we need to change your treatment, we will call you to review the results.   Testing/Procedures: None ordered   Follow-Up: At [facility], you and your health needs are our priority.  As part of our continuing mission to provide you with exceptional heart care, we have created designated Provider Care Teams.  These Care Teams include your primary Cardiologist (physician) and Advanced Practice Providers (APPs -  Physician Assistants and Nurse Practitioners) who all work together to provide you with the care you need, when you need it.  We recommend signing up for the patient  portal called "MyChart".  Sign up information is provided on this After Visit Summary.  MyChart is used to connect with patients for Virtual Visits (Telemedicine).  Patients are able to view lab/test results, encounter notes, upcoming appointments, etc.  Non-urgent messages can be sent to your provider as well.   To learn more about what you can do with MyChart, go to [email or web address].    Your next appointment:   12 month(s)  The format for your next appointment:   In Person  Provider:   Sanda Klein, MD {  I    Signed, Sanda Klein, MD  07/31/2021 7:05 PM    [place]

## 2021-08-05 ENCOUNTER — Ambulatory Visit: Payer: 59 | Admitting: Cardiovascular Disease

## 2022-01-06 DIAGNOSIS — I10 Essential (primary) hypertension: Secondary | ICD-10-CM | POA: Diagnosis not present

## 2022-01-06 DIAGNOSIS — Z125 Encounter for screening for malignant neoplasm of prostate: Secondary | ICD-10-CM | POA: Diagnosis not present

## 2022-01-06 DIAGNOSIS — Z0000 Encounter for general adult medical examination without abnormal findings: Secondary | ICD-10-CM | POA: Diagnosis not present

## 2022-01-11 DIAGNOSIS — R351 Nocturia: Secondary | ICD-10-CM | POA: Diagnosis not present

## 2022-01-11 DIAGNOSIS — Z23 Encounter for immunization: Secondary | ICD-10-CM | POA: Diagnosis not present

## 2022-01-11 DIAGNOSIS — I10 Essential (primary) hypertension: Secondary | ICD-10-CM | POA: Diagnosis not present

## 2022-01-11 DIAGNOSIS — I2510 Atherosclerotic heart disease of native coronary artery without angina pectoris: Secondary | ICD-10-CM | POA: Diagnosis not present

## 2022-01-11 DIAGNOSIS — I480 Paroxysmal atrial fibrillation: Secondary | ICD-10-CM | POA: Diagnosis not present

## 2022-01-11 DIAGNOSIS — M159 Polyosteoarthritis, unspecified: Secondary | ICD-10-CM | POA: Diagnosis not present

## 2022-01-11 DIAGNOSIS — Z0000 Encounter for general adult medical examination without abnormal findings: Secondary | ICD-10-CM | POA: Diagnosis not present

## 2022-02-10 DIAGNOSIS — I2510 Atherosclerotic heart disease of native coronary artery without angina pectoris: Secondary | ICD-10-CM | POA: Diagnosis not present

## 2022-03-31 ENCOUNTER — Ambulatory Visit (INDEPENDENT_AMBULATORY_CARE_PROVIDER_SITE_OTHER): Payer: PPO | Admitting: Family Medicine

## 2022-03-31 ENCOUNTER — Emergency Department (HOSPITAL_BASED_OUTPATIENT_CLINIC_OR_DEPARTMENT_OTHER)
Admission: EM | Admit: 2022-03-31 | Discharge: 2022-04-01 | Disposition: A | Discharge status: Home / Self Care | Payer: PPO | Attending: Emergency Medicine | Admitting: Emergency Medicine

## 2022-03-31 ENCOUNTER — Emergency Department (HOSPITAL_BASED_OUTPATIENT_CLINIC_OR_DEPARTMENT_OTHER): Payer: PPO

## 2022-03-31 ENCOUNTER — Ambulatory Visit (INDEPENDENT_AMBULATORY_CARE_PROVIDER_SITE_OTHER): Payer: PPO

## 2022-03-31 VITALS — BP 140/84 | HR 63 | Ht 71.0 in | Wt 193.0 lb

## 2022-03-31 DIAGNOSIS — I4891 Unspecified atrial fibrillation: Secondary | ICD-10-CM | POA: Diagnosis not present

## 2022-03-31 DIAGNOSIS — Z7901 Long term (current) use of anticoagulants: Secondary | ICD-10-CM | POA: Diagnosis not present

## 2022-03-31 DIAGNOSIS — I6523 Occlusion and stenosis of bilateral carotid arteries: Secondary | ICD-10-CM | POA: Diagnosis not present

## 2022-03-31 DIAGNOSIS — M542 Cervicalgia: Secondary | ICD-10-CM | POA: Insufficient documentation

## 2022-03-31 DIAGNOSIS — M531 Cervicobrachial syndrome: Secondary | ICD-10-CM | POA: Diagnosis not present

## 2022-03-31 DIAGNOSIS — G8929 Other chronic pain: Secondary | ICD-10-CM

## 2022-03-31 DIAGNOSIS — Z20822 Contact with and (suspected) exposure to covid-19: Secondary | ICD-10-CM | POA: Insufficient documentation

## 2022-03-31 DIAGNOSIS — M436 Torticollis: Secondary | ICD-10-CM | POA: Diagnosis not present

## 2022-03-31 LAB — CBC WITH DIFFERENTIAL/PLATELET
Abs Immature Granulocytes: 0.02 10*3/uL (ref 0.00–0.07)
Basophils Absolute: 0 10*3/uL (ref 0.0–0.1)
Basophils Relative: 0 %
Eosinophils Absolute: 0 10*3/uL (ref 0.0–0.5)
Eosinophils Relative: 0 %
HCT: 40.9 % (ref 39.0–52.0)
Hemoglobin: 14.4 g/dL (ref 13.0–17.0)
Immature Granulocytes: 0 %
Lymphocytes Relative: 7 %
Lymphs Abs: 0.7 10*3/uL (ref 0.7–4.0)
MCH: 29.6 pg (ref 26.0–34.0)
MCHC: 35.2 g/dL (ref 30.0–36.0)
MCV: 84.2 fL (ref 80.0–100.0)
Monocytes Absolute: 0.7 10*3/uL (ref 0.1–1.0)
Monocytes Relative: 8 %
Neutro Abs: 7.9 10*3/uL — ABNORMAL HIGH (ref 1.7–7.7)
Neutrophils Relative %: 85 %
Platelets: 175 10*3/uL (ref 150–400)
RBC: 4.86 MIL/uL (ref 4.22–5.81)
RDW: 13.9 % (ref 11.5–15.5)
WBC: 9.3 10*3/uL (ref 4.0–10.5)
nRBC: 0 % (ref 0.0–0.2)

## 2022-03-31 LAB — BASIC METABOLIC PANEL
Anion gap: 12 (ref 5–15)
BUN: 21 mg/dL (ref 8–23)
CO2: 21 mmol/L — ABNORMAL LOW (ref 22–32)
Calcium: 9.2 mg/dL (ref 8.9–10.3)
Chloride: 101 mmol/L (ref 98–111)
Creatinine, Ser: 0.85 mg/dL (ref 0.61–1.24)
GFR, Estimated: >60 mL/min (ref >60)
Glucose, Bld: 118 mg/dL — ABNORMAL HIGH (ref 70–99)
Potassium: 3.6 mmol/L (ref 3.5–5.1)
Sodium: 134 mmol/L — ABNORMAL LOW (ref 135–145)

## 2022-03-31 LAB — SARS CORONAVIRUS 2 BY RT PCR: SARS Coronavirus 2 by RT PCR: NEGATIVE

## 2022-03-31 LAB — LACTIC ACID, PLASMA: Lactic Acid, Venous: 1.1 mmol/L (ref 0.5–1.9)

## 2022-03-31 MED ORDER — KETOROLAC TROMETHAMINE 30 MG/ML IJ SOLN
15.0000 mg | Freq: Once | INTRAMUSCULAR | Status: AC
  Administered 2022-03-31: 15 mg via INTRAVENOUS
  Filled 2022-03-31: qty 1

## 2022-03-31 MED ORDER — MORPHINE SULFATE (PF) 4 MG/ML IV SOLN
4.0000 mg | Freq: Once | INTRAVENOUS | Status: AC
  Administered 2022-03-31: 4 mg via INTRAVENOUS
  Filled 2022-03-31: qty 1

## 2022-03-31 MED ORDER — DIAZEPAM 2 MG PO TABS: 2.0000 mg | ORAL_TABLET | Freq: Three times a day (TID) | ORAL | 0 refills | Status: DC | PRN

## 2022-03-31 MED ORDER — METHOCARBAMOL 500 MG PO TABS: 500.0000 mg | ORAL_TABLET | Freq: Three times a day (TID) | ORAL | 0 refills | Status: DC | PRN

## 2022-03-31 MED ORDER — DIAZEPAM 5 MG PO TABS
2.5000 mg | ORAL_TABLET | Freq: Once | ORAL | Status: AC
  Administered 2022-04-01: 2.5 mg via ORAL
  Filled 2022-03-31: qty 1

## 2022-03-31 MED ORDER — IOHEXOL 350 MG/ML SOLN
75.0000 mL | Freq: Once | INTRAVENOUS | Status: AC | PRN
  Administered 2022-03-31: 75 mL via INTRAVENOUS

## 2022-03-31 MED ORDER — ONDANSETRON HCL 4 MG/2ML IJ SOLN
4.0000 mg | Freq: Once | INTRAMUSCULAR | Status: AC
  Administered 2022-03-31: 4 mg via INTRAVENOUS
  Filled 2022-03-31: qty 2

## 2022-03-31 NOTE — Discharge Instructions (Addendum)
For pain control you may take at 1000 mg of Tylenol every 8 hours as needed. 

## 2022-03-31 NOTE — Progress Notes (Signed)
I, Peterson Lombard, LAT, ATC acting as a scribe for Lynne Leader, MD.  Subjective:    CC: Neck pain  HPI: Pt is a 66 y/o male c/o neck pain that's ongoing for years intermittently, that has been exceptionally bad last night and today. Pt has been in tree removal work for 48 years, he owns the company, and does all the estimates, which involves looking up. Pt locates pain to both sides of his neck that will also cause tension into his jar. Pt was doing push-ups yesterday when he really noticed the neck pain. Pt used to be seen for this issue by Dr. Roxy Manns ([facility]). He has an appointment with [facility] today after today's visit with me.   Radiates: no UE Numbness/tingling: no UE Weakness: no Aggravates: cervical rotation, ext, trying to sleep at night Treatments tried: stay active, IBU  Pertinent review of Systems: no fever or chills  Relevant historical information: CAD   Objective:    Vitals:   03/31/22 1324  BP: (!) 140/84  Pulse: 63  SpO2: 94%   General: Well Developed, well nourished, and in no acute distress.   MSK: C-spine: Normal. Nontender midline. Tender palpation paraspinal muscles near the superior portion of the cervical spine. Decreased cervical motion. Intact upper extremity strength and reflexes.  Lab and Radiology Results No results found for this or any previous visit (from the past 72 hour(s)). EXAM: CERVICAL SPINE - 2-3 VIEW   COMPARISON:  None Available.   FINDINGS: Normal cervical lordosis.   No evidence of fracture or dislocation. Vertebral body heights are maintained. Dens appears intact. Lateral masses of C1 are symmetric.   No prevertebral soft tissue swelling.   Visualized lung apices are clear.   IMPRESSION: Negative cervical spine radiographs.     Electronically Signed   By: Julian Hy M.D.   On: 03/31/2022 21:21.xray     Impression and Recommendations:    Assessment and Plan: 66 y.o. male with acute  exacerbation of chronic neck pain.  Pain thought to be due to muscle spasm and dysfunction.  Plan for physical therapy referral.  He already has an established relationship with [facility] and will be seeing them today but a formal referral will broaden the ability of the physical therapist and further communication.  Additionally prescribed methocarbamol and heating pad and recommend TENS unit and percussive massager.  Check back in 6 weeks. Marland Kitchen  PDMP not reviewed this encounter. Orders Placed This Encounter  Procedures   DG Cervical Spine 2 or 3 views    Standing Status:   Future    Number of Occurrences:   1    Standing Expiration Date:   04/01/2023    Order Specific Question:   Reason for Exam (SYMPTOM  OR DIAGNOSIS REQUIRED)    Answer:   neck pain    Order Specific Question:   Preferred imaging location?    Answer:   [facility]   Ambulatory referral to Physical Therapy    Referral Priority:   Routine    Referral Type:   Physical Medicine    Referral Reason:   Specialty Services Required    Requested Specialty:   Physical Therapy    Number of Visits Requested:   1   Meds ordered this encounter  Medications   methocarbamol (ROBAXIN) 500 MG tablet    Sig: Take 1 tablet (500 mg total) by mouth every 8 (eight) hours as needed for muscle spasms.    Dispense:  90 tablet    Refill:  0    Discussed warning signs or symptoms. Please see discharge instructions. Patient expresses understanding.   The above documentation has been reviewed and is accurate and complete Lynne Leader, M.D.

## 2022-03-31 NOTE — ED Triage Notes (Signed)
Pt states that he worked out yesterday, causing his neck to get tight. Pt states he went to orthopedic surgeon, who did xrays. Pt went to PT today, he says it made it worse.   Pt states no relief methocarbamol '1000mg'$ .

## 2022-03-31 NOTE — Patient Instructions (Addendum)
Thank you for coming in today.   Please get an Xray today before you leave   I've referred you to Physical Therapy.    Take the muscle relaxer as needed up to 3x daily but mostly take it at night.   Tylenol is safer for you.   Heating pad.   Recheck in about 6 weeks unless better.   Return sooner or contact me sooner if not doing well.   TENS UNIT: This is helpful for muscle pain and spasm.   Search and Purchase a TENS 7000 2nd edition at  www.tenspros.com or www.[employer].com It should be less than $30.     TENS unit instructions: Do not shower or bathe with the unit on Turn the unit off before removing electrodes or batteries If the electrodes lose stickiness add a drop of water to the electrodes after they are disconnected from the unit and place on plastic sheet. If you continued to have difficulty, call the TENS unit company to purchase more electrodes. Do not apply lotion on the skin area prior to use. Make sure the skin is clean and dry as this will help prolong the life of the electrodes. After use, always check skin for unusual red areas, rash or other skin difficulties. If there are any skin problems, does not apply electrodes to the same area. Never remove the electrodes from the unit by pulling the wires. Do not use the TENS unit or electrodes other than as directed. Do not change electrode placement without consultating your therapist or physician. Keep 2 fingers with between each electrode. Wear time ratio is 2:1, on to off times.    For example on for 30 minutes off for 15 minutes and then on for 30 minutes off for 15 minutes

## 2022-03-31 NOTE — ED Provider Notes (Signed)
[facility] Provider Note   CSN: [id number] Arrival date & time: 03/31/22  1756     History  Chief Complaint  Patient presents with   Neck Pain    Steven Hamilton is a 66 y.o. male.  Patient with severe neck pain and not able to turn his head.  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  While he was doing push-ups 2 days ago he felt a "tightening" to his left neck that has been coming quite severe.  He did not take anything for it at home.  He went to his orthopedic doctor today who referred him to [facility] and did x-rays.  He states after going to PT today his pain became substantially worse.  He states his neck was not manipulated but there was massage and heat used.  He has severe pain to his left posterior neck and is unable to move it at all.  Denies any weakness, numbness or tingling.  Not think he had a fever.  No cough, runny nose or sore throat.  No headache.  No chest pain or shortness of breath.  Does take Eliquis for history of atrial fibrillation.  The history is provided by the patient.  Neck Pain Associated symptoms: no chest pain and no fever        Home Medications Prior to Admission medications   Medication Sig Start Date End Date Taking? Authorizing Provider  ELIQUIS 5 MG TABS tablet Take 5 mg by mouth 2 (two) times daily. 07/20/21   [provider]  losartan (COZAAR) 50 MG tablet Take 50 mg by mouth daily.    [provider]  methocarbamol (ROBAXIN) 500 MG tablet Take 1 tablet (500 mg total) by mouth every 8 (eight) hours as needed for muscle spasms. 03/31/22   Gregor Hams, MD  psyllium (METAMUCIL) 58.6 % powder Take 1 packet by mouth daily. Patient not taking: Reported on 07/29/2021    [provider]  rosuvastatin (CRESTOR) 20 MG tablet Take 1 tablet by mouth daily.    [provider]  verapamil (CALAN-SR) 120 MG CR tablet TAKE ONE  TABLET AT BEDTIME. 05/14/21   Croitoru, Dani Gobble, MD      Allergies    Patient has no known allergies.    Review of Systems   Review of Systems  Constitutional:  Negative for activity change, appetite change and fever.  HENT:  Negative for congestion and rhinorrhea.   Respiratory:  Negative for cough and shortness of breath.   Cardiovascular:  Negative for chest pain.  Gastrointestinal:  Negative for abdominal pain, nausea and vomiting.  Genitourinary:  Negative for dysuria and hematuria.  Musculoskeletal:  Positive for neck pain. Negative for arthralgias and joint swelling.   all other systems are negative except as noted in the HPI and PMH.    Physical Exam Updated Vital Signs BP (!) 175/89   Pulse 75   Resp 18   SpO2 100%  Physical Exam Vitals and nursing note reviewed.  Constitutional:      General: He is not in acute distress.    Appearance: He is well-developed.     Comments: Uncomfortable, feels warm  HENT:     Head: Normocephalic and atraumatic.     Mouth/Throat:     Pharynx: No oropharyngeal exudate.  Eyes:     Conjunctiva/sclera: Conjunctivae normal.     Pupils: Pupils are equal,  round, and reactive to light.  Neck:     Comments: Paraspinal cervical tenderness bilaterally, reduced range of motion with pain with attempted range of motion. Cardiovascular:     Rate and Rhythm: Normal rate and regular rhythm.     Heart sounds: Normal heart sounds. No murmur heard. Pulmonary:     Effort: Pulmonary effort is normal. No respiratory distress.     Breath sounds: Normal breath sounds.  Abdominal:     Palpations: Abdomen is soft.     Tenderness: There is no abdominal tenderness. There is no guarding or rebound.  Musculoskeletal:        General: Normal range of motion.     Cervical back: Tenderness present.  Skin:    General: Skin is warm.  Neurological:     Mental Status: He is alert and oriented to person, place, and time.     Cranial Nerves: No cranial nerve  deficit.     Motor: No abnormal muscle tone.     Coordination: Coordination normal.     Comments:  5/5 strength throughout. CN 2-12 intact.Equal grip strength.   Psychiatric:        Behavior: Behavior normal.     ED Results / Procedures / Treatments   Labs (all labs ordered are listed, but only abnormal results are displayed) Labs Reviewed  CBC WITH DIFFERENTIAL/PLATELET - Abnormal; Notable for the following components:      Result Value   Neutro Abs 7.9 (*)    All other components within normal limits  BASIC METABOLIC PANEL - Abnormal; Notable for the following components:   Sodium 134 (*)    CO2 21 (*)    Glucose, Bld 118 (*)    All other components within normal limits  SARS CORONAVIRUS 2 BY RT PCR  CULTURE, BLOOD (ROUTINE X 2)  CULTURE, BLOOD (ROUTINE X 2)  LACTIC ACID, PLASMA  LACTIC ACID, PLASMA    EKG None  Radiology DG Cervical Spine 2 or 3 views  Result Date: 03/31/2022 CLINICAL DATA:  Neck pain EXAM: CERVICAL SPINE - 2-3 VIEW COMPARISON:  None Available. FINDINGS: Normal cervical lordosis. No evidence of fracture or dislocation. Vertebral body heights are maintained. Dens appears intact. Lateral masses of C1 are symmetric. No prevertebral soft tissue swelling. Visualized lung apices are clear. IMPRESSION: Negative cervical spine radiographs. Electronically Signed   By: Julian Hy M.D.   On: 03/31/2022 21:21   CT Cervical Spine Wo Contrast  Result Date: 03/31/2022 CLINICAL DATA:  Neck stiffness after working out EXAM: CT CERVICAL SPINE WITHOUT CONTRAST TECHNIQUE: Multidetector CT imaging of the cervical spine was performed without intravenous contrast. Multiplanar CT image reconstructions were also generated. RADIATION DOSE REDUCTION: This exam was performed according to the departmental dose-optimization program which includes automated exposure control, adjustment of the mA and/or kV according to patient size and/or use of iterative reconstruction technique.  COMPARISON:  None Available. FINDINGS: Alignment: Normal cervical lordosis. Skull base and vertebrae: No acute fracture. No primary bone lesion or focal pathologic process. Soft tissues and spinal canal: No prevertebral fluid or swelling. No visible canal hematoma. Disc levels: Very mild degenerative changes at C5-6 and C6-7. Spinal canal is patent. Upper chest: Lung apices are clear Other: Visualized thyroid is unremarkable. IMPRESSION: Negative cervical spine CT. Electronically Signed   By: Julian Hy M.D.   On: 03/31/2022 21:20    Procedures Procedures    Medications Ordered in ED Medications  morphine (PF) 4 MG/ML injection 4 mg (has no administration in  time range)  ondansetron (ZOFRAN) injection 4 mg (has no administration in time range)  ketorolac (TORADOL) 30 MG/ML injection 15 mg (has no administration in time range)    ED Course/ Medical Decision Making/ A&P                           Medical Decision Making Amount and/or Complexity of Data Reviewed Labs: ordered. Decision-making details documented in ED Course. Radiology: ordered and independent interpretation performed. Decision-making details documented in ED Course. ECG/medicine tests: ordered and independent interpretation performed. Decision-making details documented in ED Course.  Risk Prescription drug management.  Acute on chronic neck pain, substantially worse after doing push-ups 2 days ago.  Now worse after doing physical therapy earlier today.  Unable to range neck at all.  Equal strength and sensation bilaterally.  CT scan done in triage shows no fractures or acute pathology.  Results reviewed interpreted by me.  Patient quite uncomfortable with likely torticollis and not able to range his neck.  No fever.  Pain started after doing push-ups and he does have chronic neck pain.  No leukocytosis.  Low suspicion for meningitis.  CT scan as above in triage shows no deep space infection or significant spinal  stenosis.  Given the increase of pain after physical therapy today, CT angiogram will be obtained to assess for vascular injury.  Patient given a dose of muscle relaxers and pain control.  Care to be transferred at shift change pending CTA of neck.  He will need to be reassessed and likely sent home with muscle relaxers and anti-inflammatories.  He does take Eliquis so NSAID options are limited. Care to be transferred at shift change        Final Clinical Impression(s) / ED Diagnoses Final diagnoses:  None    Rx / DC Orders ED Discharge Orders     None         Alicia Seib, Annie Main, MD 03/31/22 2343

## 2022-04-01 ENCOUNTER — Telehealth: Payer: Self-pay | Admitting: Family Medicine

## 2022-04-01 NOTE — Telephone Encounter (Signed)
Patient seen yesterday, then went to PT. Neck was MUCH worse after PT. Patient then went to the ED and had 2 CT's.  ED staff recommended MRI to check discs before proceeding with further PT. Patient has cancelled his PT appt for today. Wondering if Dr. Georgina Snell would be agreeable to ordering an MRI. Patient is in a lot of pain and unsure how to proceed.

## 2022-04-01 NOTE — Progress Notes (Signed)
Cervical spine x-ray looks normal to radiology.

## 2022-04-01 NOTE — ED Provider Notes (Signed)
I assumed care of this patient.  Please see previous provider note for further details of Hx, PE.  Briefly patient is a 66 y.o. male who presented with neck pain. No infectious sx concerning for meningitis. Likely muscular but pending CTA to rule out vascular etiology. CTA negative. Pain did improve with previous medication.  The patient appears reasonably screened and/or stabilized for discharge and I doubt any other medical condition or other [facility] requiring further screening, evaluation, or treatment in the ED at this time. I have discussed the findings, Dx and Tx plan with the patient/family who expressed understanding and agree(s) with the plan. Discharge instructions discussed at length. The patient/family was given strict return precautions who verbalized understanding of the instructions. No further questions at time of discharge.  Disposition: Discharge  Condition: Good  ED Discharge Orders          Ordered    diazepam (VALIUM) 2 MG tablet  Every 8 hours PRN        03/31/22 2344             Follow Up: Deland Pretty, [place] [place] [phone]  Schedule an appointment as soon as possible for a visit in 2 days          Rashan Rounsaville, Grayce Sessions, MD 04/01/22 [phone]

## 2022-04-01 NOTE — Telephone Encounter (Signed)
I spoke with Steven Hamilton.  Plan to continue PT.  Will hold on MRI now.  He denies any radiating pain or weakness BL UE.  He is feeling better now compared to last night.

## 2022-04-06 LAB — CULTURE, BLOOD (ROUTINE X 2)
Culture: NO GROWTH
Culture: NO GROWTH

## 2022-04-08 DIAGNOSIS — L821 Other seborrheic keratosis: Secondary | ICD-10-CM | POA: Diagnosis not present

## 2022-04-08 DIAGNOSIS — D171 Benign lipomatous neoplasm of skin and subcutaneous tissue of trunk: Secondary | ICD-10-CM | POA: Diagnosis not present

## 2022-04-08 DIAGNOSIS — D225 Melanocytic nevi of trunk: Secondary | ICD-10-CM | POA: Diagnosis not present

## 2022-04-12 ENCOUNTER — Telehealth: Payer: Self-pay

## 2022-04-12 DIAGNOSIS — G8929 Other chronic pain: Secondary | ICD-10-CM

## 2022-04-12 NOTE — Telephone Encounter (Signed)
Patient called stating that he was told if he did was not noticing any improvement in his neck pain that he could call and we could place an order for an MRI for him. Patient would like to stay in [place] and would like to go somewhere that he could get in quicker that [facility]. I can send patient to [facility] on [address]. Patient has health team advantage and does not require any pre certifications.   Noticed in previous messages Dr. Georgina Snell is okay with MRI being ordered not sure of with or without?

## 2022-04-13 NOTE — Telephone Encounter (Signed)
MRI ordered

## 2022-05-05 ENCOUNTER — Ambulatory Visit
Admission: RE | Admit: 2022-05-05 | Discharge: 2022-05-05 | Disposition: A | Discharge status: Home / Self Care | Payer: PPO | Source: Ambulatory Visit | Attending: Family Medicine | Admitting: Family Medicine

## 2022-05-05 DIAGNOSIS — G8929 Other chronic pain: Secondary | ICD-10-CM

## 2022-05-05 DIAGNOSIS — M542 Cervicalgia: Secondary | ICD-10-CM | POA: Diagnosis not present

## 2022-05-05 DIAGNOSIS — M47812 Spondylosis without myelopathy or radiculopathy, cervical region: Secondary | ICD-10-CM | POA: Diagnosis not present

## 2022-05-05 DIAGNOSIS — M4802 Spinal stenosis, cervical region: Secondary | ICD-10-CM | POA: Diagnosis not present

## 2022-05-10 NOTE — Progress Notes (Signed)
MRI cervical spine shows some areas of advanced arthritis changes.  There are a few areas where the nerves could get pinched and there is a few areas in the spine that could cause neck pain.  These are all potential targets for injection.  You have an appointment with me scheduled for the 15th.  Please keep that appointment.  We will talk about the results in full detail and talk about treatment plan and options including neck injections.

## 2022-05-11 NOTE — Progress Notes (Unsigned)
I, Steven Hamilton, LAT, ATC acting as a scribe for Steven Leader, MD.  Steven Hamilton is a 66 y.o. male who presents to [place] at [facility] today for f/u neck pain. 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   Pt was last seen by Dr. Georgina Hamilton on 03/31/22 and was prescribed methocarbamol, advised to use a heating pad, TENS unit, and percussive massager. Pt was also referred to [facility]. After his 1st PT visit, pt reports worsening neck pain, and was seen at the [facility] ED. Pt called this office on 10/5 w/ a status update and was advised to proceed to PT. Pt called again on 10/16 reporting lack of improvement and a cervical MRI was ordered. Today, pt reports he is able rotate his head bilaterally, but cont to have pain w/ cervical extension. Pt notes increased pain at night when trying to sleep.  He denies significant radicular pain.  Pain is bilateral right worse than left.  Dx imaging: 05/05/22 C-spine MRI  03/31/22 Head/neck angio CT & C-spine CT  03/31/22 C-spine XR  Pertinent review of systems: No fevers or chills  Relevant historical information: Atrial fibrillation on Eliquis.   Exam:  BP 124/82   Pulse 63   Ht '5\' 11"'$  (1.803 m)   Wt 196 lb (88.9 kg)   SpO2 97%   BMI 27.34 kg/m  General: Well Developed, well nourished, and in no acute distress.   MSK: C-spine: Nontender midline decreased cervical motion.  Pain with extension.    Lab and Radiology Results EXAM: MRI CERVICAL SPINE WITHOUT CONTRAST   TECHNIQUE: Multiplanar, multisequence MR imaging of the cervical spine was performed. No intravenous contrast was administered.   COMPARISON:  CT scan 03/31/2022   FINDINGS: Alignment: Degenerative cervical spondylosis with mild degenerative subluxations, this is most notable at C3.   Vertebrae: Normal marrow signal. No bone lesions or fractures. Endplate reactive changes are  noted at C3-4.   Cord: Normal cord signal intensity. No cord lesions or syrinx.   Posterior Fossa, vertebral arteries, paraspinal tissues: No significant findings. There are moderate degenerative changes at C1-2 with mild pannus formation but no mass effect on the upper cervical cord.   Disc levels:   C2-3: No significant findings.   C3-4: Advanced degenerative disc disease of and facet disease with a bulging degenerated uncovered disc, osteophytic ridging and uncinate spurring. There is flattening of the ventral thecal sac and narrowing the ventral CSF space but no significant spinal stenosis. There is mild foraminal narrowing bilaterally, right greater than left.   C4-5: Degenerative disc disease and facet disease but no significant spinal or foraminal stenosis.   C5-6: Degenerative disc disease and facet disease but no significant spinal or foraminal stenosis.   C6-7: Degenerative disc disease and facet disease with left-sided uncinate spurring and facet disease contributing to moderate left foraminal stenosis. No significant spinal right foraminal stenosis.   IMPRESSION: 1. Degenerative cervical spondylosis with multilevel disc disease and facet disease. 2. Advanced degenerative disc disease at C3-4 but no significant spinal stenosis. There is mild foraminal narrowing bilaterally, right greater than left at C3-4. 3. Moderate left foraminal stenosis at C6-7.     Electronically Signed   By: Steven Hamilton M.D.   On: 05/09/2022 15:09 I, Steven Hamilton, personally (independently) visualized and performed the interpretation of the images attached in this note.     Assessment and Plan:  66 y.o. male with chronic neck pain with acute exacerbation.  Pain is bilateral mid to upper neck right worse than left.  He has multiple potential pain generators on his C-spine MRI.  The worst level appears to be C3-4 facet joints.  Plan for facet joint injection bilateral C3-4.  If this does  not work as well as we would like potential next targets could be facet joints bilaterally at C4 5 or C5-6.  We could also try an epidural steroid injection at some point as well.  If the injection is helpful and we have identified potential pain generators we could move onto medial branch block and ablation especially if the facet injections do not last.  I went over the results of the MRI as well as the imaging and the above treatment plan with Steven Hamilton who expresses understanding and agreement.  He will keep me updated with how he goes with the injections.  We could also retry physical therapy at some point as well.   PDMP not reviewed this encounter. Orders Placed This Encounter  Procedures   DG FACET JT INJ L /S SINGLE LEVEL LEFT W/FL/CT    Standing Status:   Future    Standing Expiration Date:   05/13/2023    Order Specific Question:   Reason for Exam (SYMPTOM  OR DIAGNOSIS REQUIRED)    Answer:   BL C3-4    Order Specific Question:   Preferred Imaging Location?    Answer:   [facility]    Order Specific Question:   Radiology Contrast Protocol - do NOT remove file path    Answer:   \\[email or web address]   DG FACET JT INJ L /S SINGLE LEVEL RIGHT W/FL/CT    Standing Status:   Future    Standing Expiration Date:   05/13/2023    Order Specific Question:   Reason for Exam (SYMPTOM  OR DIAGNOSIS REQUIRED)    Answer:   BL C3-4    Order Specific Question:   Preferred Imaging Location?    Answer:   [facility]    Order Specific Question:   Radiology Contrast Protocol - do NOT remove file path    Answer:   \\[email or web address]   No orders of the defined types were placed in this encounter.    Discussed warning signs or symptoms. Please see discharge instructions. Patient expresses understanding.   The above documentation has been reviewed and is accurate and complete Steven Hamilton, M.D. Total encounter  time 30 minutes including face-to-face time with the patient and, reviewing past medical record, and charting on the date of service.

## 2022-05-12 ENCOUNTER — Ambulatory Visit (INDEPENDENT_AMBULATORY_CARE_PROVIDER_SITE_OTHER): Payer: PPO | Admitting: Family Medicine

## 2022-05-12 VITALS — BP 124/82 | HR 63 | Ht 71.0 in | Wt 196.0 lb

## 2022-05-12 DIAGNOSIS — M542 Cervicalgia: Secondary | ICD-10-CM | POA: Diagnosis not present

## 2022-05-12 DIAGNOSIS — M47812 Spondylosis without myelopathy or radiculopathy, cervical region: Secondary | ICD-10-CM | POA: Diagnosis not present

## 2022-05-12 DIAGNOSIS — G8929 Other chronic pain: Secondary | ICD-10-CM | POA: Diagnosis not present

## 2022-05-12 NOTE — Patient Instructions (Signed)
Thank you for coming in today.   Please call [facility] at [phone] to schedule your spine injection.    Let me know if the shot does not work like you expect.  We have backup plans.

## 2022-05-27 ENCOUNTER — Other Ambulatory Visit: Payer: Self-pay | Admitting: Cardiovascular Disease

## 2022-08-05 ENCOUNTER — Encounter (HOSPITAL_COMMUNITY): Payer: Self-pay | Admitting: *Deleted

## 2022-10-13 ENCOUNTER — Other Ambulatory Visit: Payer: Self-pay

## 2022-10-13 ENCOUNTER — Ambulatory Visit (INDEPENDENT_AMBULATORY_CARE_PROVIDER_SITE_OTHER): Payer: PPO | Admitting: Family Medicine

## 2022-10-13 ENCOUNTER — Encounter: Payer: Self-pay | Admitting: Family Medicine

## 2022-10-13 VITALS — BP 126/80 | HR 54 | Ht 71.0 in | Wt 192.2 lb

## 2022-10-13 DIAGNOSIS — M79672 Pain in left foot: Secondary | ICD-10-CM

## 2022-10-13 NOTE — Progress Notes (Unsigned)
   Rubin Payor, PhD, LAT, ATC acting as a scribe for [name], MD.  Steven Hamilton is a 67 y.o. male who presents to [facility] at [facility] today for L foot pain. Pt was last seen by Dr. Denyse Amass on 05/12/22 for neck pain.  Today, pt c/o L foot pain x 5-6 weeks, MOI unknown. Notes increasing speed/distance on the treadmill. Pt locates pain to lateral aspect of the knee. Pain described as burning/stinging. Hx of PF. Denies prior fracture, long term steroid use. Sx improve upon waking, then worsen throughout the day. Notes changing shoes to HOKA a couple of weeks ago.   L foot swelling: no Aggravates: WB or ambulating for prolonged periods of time, certain positions when laying in bed.  Treatments tried: none  Pertinent review of systems: No fevers or chills  Relevant historical information: Coronary artery disease   Exam:  BP 126/80   Pulse (!) 54   Ht  (1.803 m)   Wt 192 lb 3.2 oz (87.2 kg)   SpO2 96%   BMI 26.81 kg/m  General: Well Developed, well nourished, and in no acute distress.   MSK: Left foot: Normal-appearing Tender palpation proximal fifth metatarsal laterally. Normal foot and ankle motion. Intact strength.    Lab and Radiology Results  Diagnostic Limited MSK Ultrasound of: Left lateral foot Intact bony cortex of fifth metatarsal.  Hypoechoic fluid collections proximal to the fifth metatarsal near the insertion site of the peroneal brevis tendon. Increased vascular activity on Doppler in this region.  This is consistent with inflammation Impression: Soft tissue inflammation near the fifth metatarsal.   X-ray images left foot ordered but not obtained   Assessment and Plan: 67 y.o. male with left lateral foot pain thought to be soft tissue inflammation around the fifth metatarsal.  Doubtful for fracture.  Unfortunately x-ray was not obtained after the visit.  Plan for conservative management trial of compression ankle sleeve  Voltaren gel and home exercise program.  Recheck in a month We talked about safe dosing of ibuprofen and Tylenol.  PDMP not reviewed this encounter. Orders Placed This Encounter  Procedures   [place] LIMITED JOINT SPACE STRUCTURES LOW LEFT(NO LINKED CHARGES)    Order Specific Question:   Reason for Exam (SYMPTOM  OR DIAGNOSIS REQUIRED)    Answer:   left lateral foot pain    Order Specific Question:   Preferred imaging location?    Answer:   [facility] Foot Complete Left    Standing Status:   Future    Standing Expiration Date:   10/13/2023    Order Specific Question:   Reason for Exam (SYMPTOM  OR DIAGNOSIS REQUIRED)    Answer:   left lateral foot pain    Order Specific Question:   Preferred imaging location?    Answer:   [facility]   No orders of the defined types were placed in this encounter.    Discussed warning signs or symptoms. Please see discharge instructions. Patient expresses understanding.   The above documentation has been reviewed and is accurate and complete [name], M.D.

## 2022-10-13 NOTE — Patient Instructions (Addendum)
Thank you for coming in today.   Please use Voltaren gel (Generic Diclofenac Gel) up to 4x daily for pain as needed.  This is available over-the-counter as both the name brand Voltaren gel and the generic diclofenac gel.   Please complete the exercises that the athletic trainer went over with you:  View at www.my-exercise-code.com using code: EAN3XUP  I recommend you obtained a compression sleeve to help with your joint problems. There are many options on the market however I recommend obtaining a Full Ankle Body Helix compression sleeve.  You can find information (including how to appropriate measure yourself for sizing) can be found at www.Body GrandRap[employer]Wifi.ch.  Many of these products are health savings account (HSA) eligible.  You can use the compression sleeve at any time throughout the day but is most important to use while being active as well as for 2 hours post-activity.   It is appropriate to ice following activity with the compression sleeve in place.   Check back in 1 month

## 2022-11-10 ENCOUNTER — Ambulatory Visit (INDEPENDENT_AMBULATORY_CARE_PROVIDER_SITE_OTHER): Payer: PPO

## 2022-11-10 ENCOUNTER — Encounter: Payer: Self-pay | Admitting: Family Medicine

## 2022-11-10 ENCOUNTER — Ambulatory Visit (INDEPENDENT_AMBULATORY_CARE_PROVIDER_SITE_OTHER): Payer: PPO | Admitting: Family Medicine

## 2022-11-10 VITALS — BP 160/90 | HR 72 | Ht 71.0 in | Wt 194.6 lb

## 2022-11-10 DIAGNOSIS — M79672 Pain in left foot: Secondary | ICD-10-CM

## 2022-11-10 NOTE — Progress Notes (Unsigned)
   I, [name], CMA acting as a scribe for [name], MD.  Steven Hamilton is a 67 y.o. male who presents to [facility] at [facility] today for 59-month f/u L lateral foot pain. Pt was last seen by Dr. Denyse Amass on 10/13/22 and was advised to use a compression ankle sleeve, Voltaren gel, and taught HEP. Today, pt reports slight improvement of sx since last visit. Compliant with HEP. Tried using compression sleeve, this seemed to exacerbate sx. Voltaren has been somewhat helpful. Rest has been the most helpful. Has been using stair climber more than walking on the treadmill. Denies new or worsening sx since last visit. BP is elevated today, notes having a tough day d/t personal life stressors.   Dx imaging: L foot XR- pending  Pertinent review of systems: No fevers or chills  Relevant historical information: Coronary artery disease.   Exam:  BP (!) 160/90   Pulse 72   Ht 5\' 11"  (1.803 m)   Wt 194 lb 9.6 oz (88.3 kg)   SpO2 96%   BMI 27.14 kg/m  General: Well Developed, well nourished, and in no acute distress.   MSK: Left foot mild pes planus.  Tender palpation proximal fifth metatarsal.  Normal foot and ankle motion.  Intact strength.    Lab and Radiology Results  X-ray images left foot obtained today personally and independently interpreted Possible healed or healing fracture distal fifth metatarsal.  No abnormality seen at proximal fifth metatarsal. Await formal radiology review     Assessment and Plan: 67 y.o. male with chronic left foot pain.  Pain is located more at the proximal fifth metatarsal.  He does have a little bit of an abnormality seen at the distal fifth metatarsal which could indicate a old fracture or healing fracture.  We talked about offloading pressure from the proximal fifth metatarsal including cutting the small hole in his shoe.  Await for radiology over read if not better consider MRI.   PDMP not reviewed this encounter. No orders  of the defined types were placed in this encounter.  No orders of the defined types were placed in this encounter.    Discussed warning signs or symptoms. Please see discharge instructions. Patient expresses understanding.   The above documentation has been reviewed and is accurate and complete [name], M.D.

## 2022-11-10 NOTE — Patient Instructions (Signed)
Thank you for coming in today.   Get xray ordered last month (today).   Try modifying your work boot.   If not better let me know and I can order an MRI.

## 2022-11-15 NOTE — Progress Notes (Signed)
Left foot x-ray shows a bit of a bunion and a heel spur. The area you are experiencing pain looks normal to radiology.Marland Kitchen

## 2022-11-25 ENCOUNTER — Other Ambulatory Visit: Payer: Self-pay | Admitting: Cardiovascular Disease

## 2023-01-11 DIAGNOSIS — Z125 Encounter for screening for malignant neoplasm of prostate: Secondary | ICD-10-CM | POA: Diagnosis not present

## 2023-01-11 DIAGNOSIS — Z0000 Encounter for general adult medical examination without abnormal findings: Secondary | ICD-10-CM | POA: Diagnosis not present

## 2023-01-14 DIAGNOSIS — I10 Essential (primary) hypertension: Secondary | ICD-10-CM | POA: Diagnosis not present

## 2023-01-14 DIAGNOSIS — I480 Paroxysmal atrial fibrillation: Secondary | ICD-10-CM | POA: Diagnosis not present

## 2023-01-14 DIAGNOSIS — M19042 Primary osteoarthritis, left hand: Secondary | ICD-10-CM | POA: Diagnosis not present

## 2023-01-14 DIAGNOSIS — R351 Nocturia: Secondary | ICD-10-CM | POA: Diagnosis not present

## 2023-01-14 DIAGNOSIS — B351 Tinea unguium: Secondary | ICD-10-CM | POA: Diagnosis not present

## 2023-01-14 DIAGNOSIS — J309 Allergic rhinitis, unspecified: Secondary | ICD-10-CM | POA: Diagnosis not present

## 2023-01-14 DIAGNOSIS — D179 Benign lipomatous neoplasm, unspecified: Secondary | ICD-10-CM | POA: Diagnosis not present

## 2023-01-14 DIAGNOSIS — Z0000 Encounter for general adult medical examination without abnormal findings: Secondary | ICD-10-CM | POA: Diagnosis not present

## 2023-01-14 DIAGNOSIS — I2510 Atherosclerotic heart disease of native coronary artery without angina pectoris: Secondary | ICD-10-CM | POA: Diagnosis not present

## 2023-03-09 DIAGNOSIS — M13842 Other specified arthritis, left hand: Secondary | ICD-10-CM | POA: Diagnosis not present

## 2023-03-09 DIAGNOSIS — M79642 Pain in left hand: Secondary | ICD-10-CM | POA: Diagnosis not present

## 2023-03-14 ENCOUNTER — Ambulatory Visit: Payer: PPO | Attending: Cardiovascular Disease | Admitting: Cardiovascular Disease

## 2023-03-14 ENCOUNTER — Encounter: Payer: Self-pay | Admitting: Cardiovascular Disease

## 2023-03-14 VITALS — BP 150/80 | HR 53 | Ht 71.0 in | Wt 190.2 lb

## 2023-03-14 DIAGNOSIS — I493 Ventricular premature depolarization: Secondary | ICD-10-CM

## 2023-03-14 DIAGNOSIS — I2510 Atherosclerotic heart disease of native coronary artery without angina pectoris: Secondary | ICD-10-CM

## 2023-03-14 DIAGNOSIS — Z8679 Personal history of other diseases of the circulatory system: Secondary | ICD-10-CM

## 2023-03-14 DIAGNOSIS — E7800 Pure hypercholesterolemia, unspecified: Secondary | ICD-10-CM

## 2023-03-14 DIAGNOSIS — I10 Essential (primary) hypertension: Secondary | ICD-10-CM

## 2023-03-14 MED ORDER — ASPIRIN 81 MG PO TBEC: 81.0000 mg | DELAYED_RELEASE_TABLET | Freq: Every day | ORAL | Status: AC

## 2023-03-14 NOTE — Patient Instructions (Signed)
Medication Instructions:  STOP ELIQUIS START ASPIRIN 81 MG daily *If you need a refill on your cardiac medications before your next appointment, please call your pharmacy*  Follow-Up: At [facility], you and your health needs are our priority.  As part of our continuing mission to provide you with exceptional heart care, we have created designated Provider Care Teams.  These Care Teams include your primary Cardiologist (physician) and Advanced Practice Providers (APPs -  Physician Assistants and Nurse Practitioners) who all work together to provide you with the care you need, when you need it.  We recommend signing up for the patient portal called "MyChart".  Sign up information is provided on this After Visit Summary.  MyChart is used to connect with patients for Virtual Visits (Telemedicine).  Patients are able to view lab/test results, encounter notes, upcoming appointments, etc.  Non-urgent messages can be sent to your provider as well.   To learn more about what you can do with MyChart, go to [email or web address].    Your next appointment:   1 year(s)  Provider:   Thurmon Fair, MD     Other Instructions Keep a log of BP for 4-5 days and give it to Dr Royann Shivers

## 2023-03-14 NOTE — Progress Notes (Unsigned)
Cardiology Office Note:    Date:  03/15/2023   ID:  Steven Hamilton [birth year], MRN [id number]  PCP:  Steven Brunette, MD   [facility] Providers Cardiologist:  Steven Fair, MD     Referring MD: Steven Brunette, MD   Chief Complaint  Patient presents with   Irregular Heart Beat    History of Present Illness:    Steven Hamilton is a 67 y.o. 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  Steven Hamilton has been faithfully wearing a smart watch which has not shown recurrence of atrial fibrillation in over 12 months.  He exercises regularly on his treadmill at home and does all his own yard work, without complaints of dyspnea or angina.  Occasionally he will feel very dizzy and lightheaded if it has been a hot day, but generally he denies dizziness or syncope and has not had any palpitations.  He has not had any falls injuries or bleeding while on anticoagulation.  He has no history of stroke, TIA or other embolic events.  He did have previous frequent monomorphic PVCs with a pattern suggesting origin from the right ventricular outflow tract.  His mother has a history of atrial fibrillation.  Cardiac catheterization in 2018 showed mild nonobstructive atherosclerosis with diffuse calcification.  An echocardiogram in 2017 showed normal left ventricular ejection fraction, mild LVH left atrium at the upper limit of normal in size (38 mm, volume index 26.5 mL/m sq) and mild mitral insufficiency and mild aortic insufficiency.  Treadmill stress testing in 2017 showed excellent exercise capacity with hypertensive response to exercise and increased frequency of PVCs likely of outflow tract origin.   He is intolerant of beta-blockers  and verapamil has been used for both blood pressure management and to suppress the PVCs, with some success.  Past Medical History:  Diagnosis Date   Hypertension     Past Surgical History:  Procedure Laterality Date   HERNIA REPAIR     LEFT HEART CATH AND CORONARY ANGIOGRAPHY N/A 01/04/2017   Procedure: Left Heart Cath and Coronary Angiography;  Surgeon: Tonny Bollman, MD;  Location: [facility] INVASIVE CV LAB;  Service: Cardiovascular;  Laterality: N/A;    Current Medications: Current Meds  Medication Sig   aspirin EC 81 MG tablet Take 1 tablet (81 mg total) by mouth daily. Swallow whole.   losartan (COZAAR) 50 MG tablet Take 50 mg by mouth daily.   rosuvastatin (CRESTOR) 40 MG tablet Take 40 mg by mouth daily.   Turmeric (QC TUMERIC COMPLEX PO) Take 2 capsules by mouth daily at 6 (six) AM.   verapamil (CALAN-SR) 120 MG CR tablet TAKE ONE TABLET BY MOUTH AT BEDTIME.   [DISCONTINUED] ELIQUIS 5 MG TABS tablet Take 5 mg by mouth 2 (two) times daily.     Allergies:   Patient has no known allergies.   Social History   Socioeconomic History   Marital status: Married    Spouse name: Not on file   Number of children: Not on file   Years of education: Not on file   Highest education level: Not on file  Occupational History   Not on file  Tobacco Use   Smoking status: Some Days    Types: Cigars   Smokeless tobacco: Never   Tobacco comments:  03/13/2024 Patient states he still smoke cigars some days  Substance and Sexual Activity   Alcohol use: Yes    Alcohol/week: 10.0 standard drinks of alcohol    Types: 10 Glasses of wine per week   Drug use: No   Sexual activity: Not on file  Other Topics Concern   Not on file  Social History Narrative   Not on file   Social Determinants of Health   Financial Resource Strain: Not on file  Food Insecurity: Not on file  Transportation Needs: Not on file  Physical Activity: Not on file  Stress: Not on file  Social Connections: Unknown  (11/10/2021)   Received [facility]   Social Network    Social Network: Not on file     Family History: The patient's family history includes Alcohol abuse in his brother; Atrial fibrillation in his mother; Cancer in his father; Hypertension in his brother and mother; Peripheral vascular disease in his brother; Prostate cancer in his father.  ROS:   Please see the history of present illness.     All other systems reviewed and are negative.  EKGs/Labs/Other Studies Reviewed:    The following studies were reviewed today: Echocardiogram 02/12/2016   - Left ventricle: The cavity size was normal. Wall thickness was   increased in a pattern of mild LVH. Systolic function was normal.   The estimated ejection fraction was in the range of 55% to 60%.   Wall motion was normal; there were no regional wall motion   abnormalities. Left ventricular diastolic function parameters   were normal.  - Aortic valve: There was mild regurgitation.  - Mitral valve: There was mild regurgitation.   IVS thickness, ED                        11.7  mm      LA ID, A-P, ES                           37.76 mm  Cardiac catheterization 01/04/2017  Dominance: Right  EKG:  EKG is ordered today.  It shows mild sinus bradycardia but is otherwise normal.  Recent Labs: 03/31/2022: BUN 21; Creatinine, Ser 0.85; Hemoglobin 14.4; Platelets 175; Potassium 3.6; Sodium 134  Recent Lipid Panel    Component Value Date/Time   CHOL 184 04/20/2012 0903   TRIG 37.0 04/20/2012 0903   HDL 44.40 04/20/2012 0903   CHOLHDL 4 04/20/2012 0903   VLDL 7.4 04/20/2012 0903   LDLCALC 132 (H) 04/20/2012 0903     Risk Assessment/Calculations:    CHA2DS2-VASc score is 2-3 (age, hypertension, +/- nonobstructive CAD)  Physical Exam:    VS:  BP (!) 150/80   Pulse (!) 53   Ht 5\' 11"  (1.803 m)   Wt 190 lb 3.2 oz (86.3 kg)   SpO2 97%   BMI 26.53 kg/m     Wt Readings from Last 3 Encounters:  03/14/23 190 lb 3.2  oz (86.3 kg)  11/10/22 194 lb 9.6 oz (88.3 kg)  10/13/22 192 lb 3.2 oz (87.2 kg)     GEN: Appears well, well nourished, well developed in no acute distress HEENT: Normal NECK: No JVD; No carotid bruits LYMPHATICS: No lymphadenopathy CARDIAC: RRR, no murmurs, rubs, gallops RESPIRATORY:  Clear to auscultation without rales, wheezing or rhonchi  ABDOMEN: Soft, non-tender, non-distended MUSCULOSKELETAL:  No edema; No deformity  SKIN: Warm and dry NEUROLOGIC:  Alert and oriented x 3 PSYCHIATRIC:  Normal affect   ASSESSMENT:    1. History of atrial fibrillation   2. Essential hypertension   3. Coronary artery disease involving native coronary artery of native heart without angina pectoris   4. Hypercholesterolemia   5. Right ventricular outflow tract premature ventricular contractions (PVCs)    PLAN:    In order of problems listed above:  Afib: He has had only one symptomatic event, spontaneously terminated.  Following an acute respiratory illness.  He has not had any arrhythmia recurrence in over a year.  CHA2DS2-VASc score 2-3, does have mild left atrial dilation probably due to hypertension.  Embolic risk is elevated, but not particularly high and the arrhythmia may have been a one-time event.  Will stop his anticoagulation, restart aspirin 81 mg daily.  Encouraged him to continue wearing his smart watch daily.   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  Asked him to check daily for for 5 days and send me a log, but did not make any changes to his medications today.  He was very poorly tolerant of diuretic therapy (dizziness, orthostatic hypotension) and beta-blockers (fatigue). CAD: Remains asymptomatic despite a very active lifestyle.  Extensive coronary calcifications, but no stenotic lesions.  The focus is on risk factor modification. HLP:  Continue rosuvastatin.  LDL is at target, less than 70, daily statin. RVOT PVCs: Seems to have had a good response to verapamil.  These are no longer noticed.           Medication Adjustments/Labs and Tests Ordered: Current medicines are reviewed at length with the patient today.  Concerns regarding medicines are outlined above.  Orders Placed This Encounter  Procedures   EKG 12-Lead   Meds ordered this encounter  Medications   aspirin EC 81 MG tablet    Sig: Take 1 tablet (81 mg total) by mouth daily. Swallow whole.    Patient Instructions  Medication Instructions:  STOP ELIQUIS START ASPIRIN 81 MG daily *If you need a refill on your cardiac medications before your next appointment, please call your pharmacy*  Follow-Up: At [facility], you and your health needs are our priority.  As part of our continuing mission to provide you with exceptional heart care, we have created designated Provider Care Teams.  These Care Teams include your primary Cardiologist (physician) and Advanced Practice Providers (APPs -  Physician Assistants and Nurse Practitioners) who all work together to provide you with the care you need, when you need it.  We recommend signing up for the patient portal called "MyChart".  Sign up information is provided on this After Visit Summary.  MyChart is used to connect with patients for Virtual Visits (Telemedicine).  Patients are able to view lab/test results, encounter notes, upcoming appointments, etc.  Non-urgent messages can be sent to your provider as well.   To learn more about what you can do with MyChart, go to [email or web address].    Your next appointment:   1 year(s)  Provider:   Thurmon Fair, MD     Other Instructions Keep a log of BP for 4-5 days and give it to Dr Royann Shivers    Signed, Steven Fair, MD  03/15/2023 12:19 PM    [facility]

## 2023-03-15 ENCOUNTER — Encounter: Payer: Self-pay | Admitting: Cardiovascular Disease

## 2023-03-29 ENCOUNTER — Other Ambulatory Visit: Payer: Self-pay | Admitting: Cardiovascular Disease

## 2023-04-13 DIAGNOSIS — L821 Other seborrheic keratosis: Secondary | ICD-10-CM | POA: Diagnosis not present

## 2023-04-13 DIAGNOSIS — D225 Melanocytic nevi of trunk: Secondary | ICD-10-CM | POA: Diagnosis not present

## 2023-04-13 DIAGNOSIS — D171 Benign lipomatous neoplasm of skin and subcutaneous tissue of trunk: Secondary | ICD-10-CM | POA: Diagnosis not present

## 2023-04-13 DIAGNOSIS — L570 Actinic keratosis: Secondary | ICD-10-CM | POA: Diagnosis not present

## 2023-12-08 ENCOUNTER — Other Ambulatory Visit: Payer: Self-pay | Admitting: Cardiovascular Disease

## 2023-12-15 DIAGNOSIS — H43823 Vitreomacular adhesion, bilateral: Secondary | ICD-10-CM | POA: Diagnosis not present

## 2023-12-15 DIAGNOSIS — H2513 Age-related nuclear cataract, bilateral: Secondary | ICD-10-CM | POA: Diagnosis not present

## 2024-01-16 DIAGNOSIS — Z125 Encounter for screening for malignant neoplasm of prostate: Secondary | ICD-10-CM | POA: Diagnosis not present

## 2024-01-16 DIAGNOSIS — I10 Essential (primary) hypertension: Secondary | ICD-10-CM | POA: Diagnosis not present

## 2024-01-16 DIAGNOSIS — I2510 Atherosclerotic heart disease of native coronary artery without angina pectoris: Secondary | ICD-10-CM | POA: Diagnosis not present

## 2024-01-17 DIAGNOSIS — H5201 Hypermetropia, right eye: Secondary | ICD-10-CM | POA: Diagnosis not present

## 2024-01-17 DIAGNOSIS — Z83511 Family history of glaucoma: Secondary | ICD-10-CM | POA: Diagnosis not present

## 2024-01-17 DIAGNOSIS — H5212 Myopia, left eye: Secondary | ICD-10-CM | POA: Diagnosis not present

## 2024-01-17 DIAGNOSIS — H524 Presbyopia: Secondary | ICD-10-CM | POA: Diagnosis not present

## 2024-01-17 DIAGNOSIS — H52223 Regular astigmatism, bilateral: Secondary | ICD-10-CM | POA: Diagnosis not present

## 2024-01-17 DIAGNOSIS — H2513 Age-related nuclear cataract, bilateral: Secondary | ICD-10-CM | POA: Diagnosis not present

## 2024-01-17 DIAGNOSIS — H40023 Open angle with borderline findings, high risk, bilateral: Secondary | ICD-10-CM | POA: Diagnosis not present

## 2024-01-19 DIAGNOSIS — N401 Enlarged prostate with lower urinary tract symptoms: Secondary | ICD-10-CM | POA: Diagnosis not present

## 2024-01-19 DIAGNOSIS — Z0000 Encounter for general adult medical examination without abnormal findings: Secondary | ICD-10-CM | POA: Diagnosis not present

## 2024-01-19 DIAGNOSIS — R351 Nocturia: Secondary | ICD-10-CM | POA: Diagnosis not present

## 2024-01-19 DIAGNOSIS — I2510 Atherosclerotic heart disease of native coronary artery without angina pectoris: Secondary | ICD-10-CM | POA: Diagnosis not present

## 2024-01-19 DIAGNOSIS — H9190 Unspecified hearing loss, unspecified ear: Secondary | ICD-10-CM | POA: Diagnosis not present

## 2024-01-19 DIAGNOSIS — I480 Paroxysmal atrial fibrillation: Secondary | ICD-10-CM | POA: Diagnosis not present

## 2024-01-19 DIAGNOSIS — Z8679 Personal history of other diseases of the circulatory system: Secondary | ICD-10-CM | POA: Diagnosis not present

## 2024-01-19 DIAGNOSIS — I10 Essential (primary) hypertension: Secondary | ICD-10-CM | POA: Diagnosis not present

## 2024-01-31 DIAGNOSIS — I10 Essential (primary) hypertension: Secondary | ICD-10-CM | POA: Diagnosis not present

## 2024-03-06 DIAGNOSIS — I10 Essential (primary) hypertension: Secondary | ICD-10-CM | POA: Diagnosis not present

## 2024-03-21 DIAGNOSIS — H47233 Glaucomatous optic atrophy, bilateral: Secondary | ICD-10-CM | POA: Diagnosis not present

## 2024-03-21 DIAGNOSIS — H53411 Scotoma involving central area, right eye: Secondary | ICD-10-CM | POA: Diagnosis not present

## 2024-03-21 DIAGNOSIS — H401131 Primary open-angle glaucoma, bilateral, mild stage: Secondary | ICD-10-CM | POA: Diagnosis not present

## 2024-03-21 DIAGNOSIS — H2513 Age-related nuclear cataract, bilateral: Secondary | ICD-10-CM | POA: Diagnosis not present

## 2024-04-16 DIAGNOSIS — D1801 Hemangioma of skin and subcutaneous tissue: Secondary | ICD-10-CM | POA: Diagnosis not present

## 2024-04-16 DIAGNOSIS — D2272 Melanocytic nevi of left lower limb, including hip: Secondary | ICD-10-CM | POA: Diagnosis not present

## 2024-04-16 DIAGNOSIS — L814 Other melanin hyperpigmentation: Secondary | ICD-10-CM | POA: Diagnosis not present

## 2024-04-16 DIAGNOSIS — L821 Other seborrheic keratosis: Secondary | ICD-10-CM | POA: Diagnosis not present

## 2024-04-16 DIAGNOSIS — D225 Melanocytic nevi of trunk: Secondary | ICD-10-CM | POA: Diagnosis not present

## 2024-04-16 DIAGNOSIS — D3617 Benign neoplasm of peripheral nerves and autonomic nervous system of trunk, unspecified: Secondary | ICD-10-CM | POA: Diagnosis not present

## 2024-04-16 DIAGNOSIS — D171 Benign lipomatous neoplasm of skin and subcutaneous tissue of trunk: Secondary | ICD-10-CM | POA: Diagnosis not present

## 2024-04-16 DIAGNOSIS — D2271 Melanocytic nevi of right lower limb, including hip: Secondary | ICD-10-CM | POA: Diagnosis not present

## 2024-04-23 DIAGNOSIS — H2513 Age-related nuclear cataract, bilateral: Secondary | ICD-10-CM | POA: Diagnosis not present

## 2024-04-23 DIAGNOSIS — H53411 Scotoma involving central area, right eye: Secondary | ICD-10-CM | POA: Diagnosis not present

## 2024-04-23 DIAGNOSIS — H47233 Glaucomatous optic atrophy, bilateral: Secondary | ICD-10-CM | POA: Diagnosis not present

## 2024-04-23 DIAGNOSIS — H401131 Primary open-angle glaucoma, bilateral, mild stage: Secondary | ICD-10-CM | POA: Diagnosis not present
